# Patient Record
Sex: Male | Born: 1955
Health system: Southern US, Community
[De-identification: ages and names within clinical notes are randomized; demographics above are authoritative.]

## PROBLEM LIST (undated history)

## (undated) DIAGNOSIS — K219 Gastro-esophageal reflux disease without esophagitis: Secondary | ICD-10-CM

## (undated) DIAGNOSIS — J302 Other seasonal allergic rhinitis: Secondary | ICD-10-CM

## (undated) DIAGNOSIS — G4733 Obstructive sleep apnea (adult) (pediatric): Secondary | ICD-10-CM

## (undated) DIAGNOSIS — J3089 Other allergic rhinitis: Secondary | ICD-10-CM

## (undated) DIAGNOSIS — I219 Acute myocardial infarction, unspecified: Secondary | ICD-10-CM

## (undated) DIAGNOSIS — E7849 Other hyperlipidemia: Secondary | ICD-10-CM

## (undated) DIAGNOSIS — J309 Allergic rhinitis, unspecified: Secondary | ICD-10-CM

## (undated) DIAGNOSIS — I213 ST elevation (STEMI) myocardial infarction of unspecified site: Secondary | ICD-10-CM

## (undated) DIAGNOSIS — F329 Major depressive disorder, single episode, unspecified: Secondary | ICD-10-CM

## (undated) DIAGNOSIS — I2510 Atherosclerotic heart disease of native coronary artery without angina pectoris: Secondary | ICD-10-CM

## (undated) DIAGNOSIS — Z87898 Personal history of other specified conditions: Secondary | ICD-10-CM

## (undated) HISTORY — DX: Other seasonal allergic rhinitis: J30.2

## (undated) HISTORY — DX: Major depressive disorder, single episode, unspecified: F32.9

## (undated) HISTORY — DX: Acute myocardial infarction, unspecified: I21.9

## (undated) HISTORY — DX: Allergic rhinitis, unspecified: J30.9

## (undated) HISTORY — DX: ST elevation (STEMI) myocardial infarction of unspecified site: I21.3

## (undated) HISTORY — DX: Personal history of other specified conditions: Z87.898

## (undated) HISTORY — DX: Other hyperlipidemia: E78.49

## (undated) HISTORY — DX: Obstructive sleep apnea (adult) (pediatric): G47.33

## (undated) HISTORY — DX: Other allergic rhinitis: J30.89

## (undated) SURGERY — LEFT HEART CATH AND CORONARY ANGIOGRAPHY
Anesthesia: Moderate Sedation

---

## 2008-05-06 ENCOUNTER — Ambulatory Visit: Payer: Self-pay | Admitting: Internal Medicine

## 2010-10-19 ENCOUNTER — Ambulatory Visit: Payer: Self-pay | Admitting: Otolaryngology

## 2011-06-01 ENCOUNTER — Ambulatory Visit: Payer: Self-pay | Admitting: Internal Medicine

## 2011-06-05 ENCOUNTER — Ambulatory Visit: Payer: Self-pay | Admitting: Internal Medicine

## 2012-04-25 ENCOUNTER — Ambulatory Visit: Payer: Self-pay | Admitting: Internal Medicine

## 2013-02-26 ENCOUNTER — Ambulatory Visit: Payer: Self-pay | Admitting: Internal Medicine

## 2013-04-15 ENCOUNTER — Ambulatory Visit: Payer: Self-pay | Admitting: Gastroenterology

## 2013-05-09 ENCOUNTER — Ambulatory Visit: Payer: Self-pay | Admitting: Physician Assistant

## 2013-05-09 LAB — URINALYSIS, COMPLETE
Bacteria: NEGATIVE
Bilirubin,UR: NEGATIVE
GLUCOSE, UR: NEGATIVE mg/dL (ref 0–75)
KETONE: NEGATIVE
Leukocyte Esterase: NEGATIVE
Nitrite: NEGATIVE
PH: 6 (ref 4.5–8.0)
Protein: NEGATIVE
SPECIFIC GRAVITY: 1.02 (ref 1.003–1.030)
SQUAMOUS EPITHELIAL: NONE SEEN
WBC UR: NONE SEEN /HPF (ref 0–5)

## 2013-05-09 LAB — GC/CHLAMYDIA PROBE AMP

## 2013-05-11 LAB — URINE CULTURE

## 2013-06-11 ENCOUNTER — Ambulatory Visit: Payer: Self-pay | Admitting: Urology

## 2014-03-15 IMAGING — US ABDOMEN ULTRASOUND LIMITED
1 series · 14 of 25 positions shown · non-contrast
Comparison: None.

CLINICAL DATA: Epigastric pain.

EXAM:
US ABDOMEN LIMITED - RIGHT UPPER QUADRANT

[Series 1: abdomen ultrasound limited · 0.25mm/px · 14 of 55 slices shown]
[im 1/55]
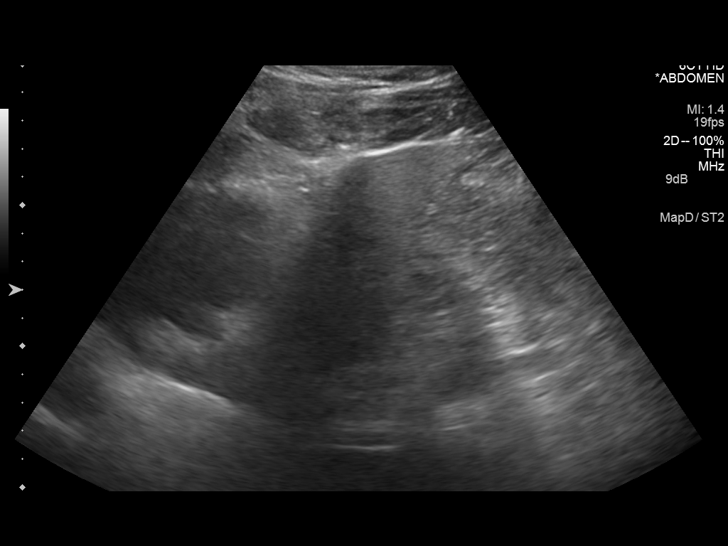
[im 5/55]
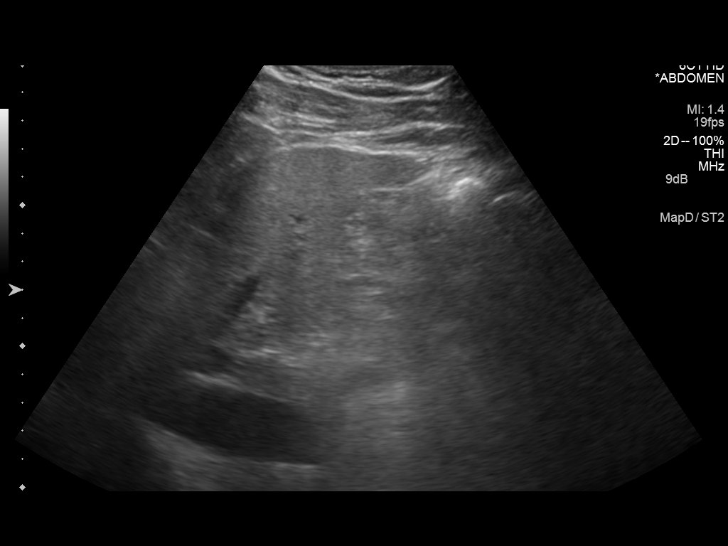
[im 10/55]
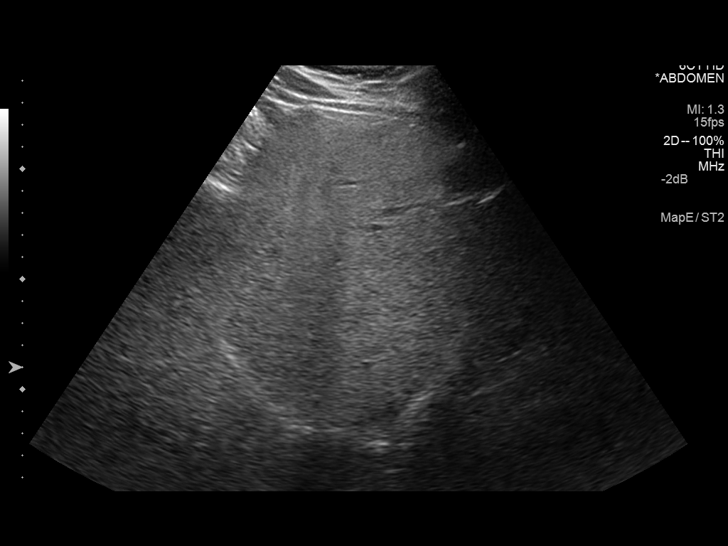
[im 14/55]
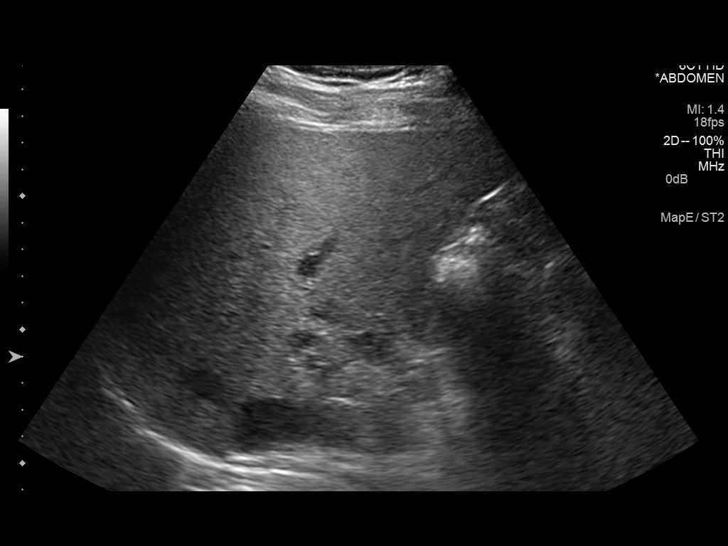
[im 19/55]
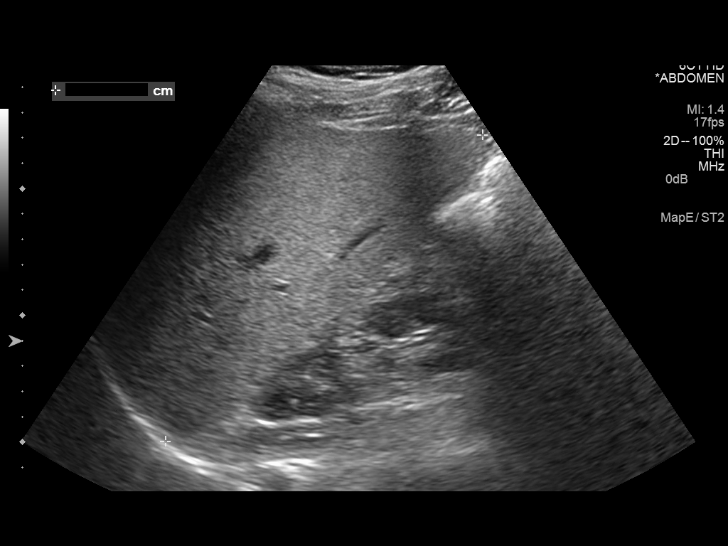
[im 21/55]
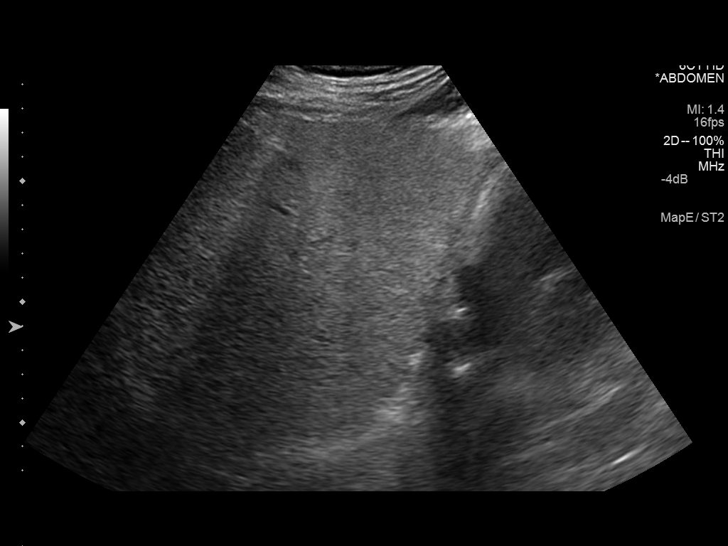
[im 25/55]
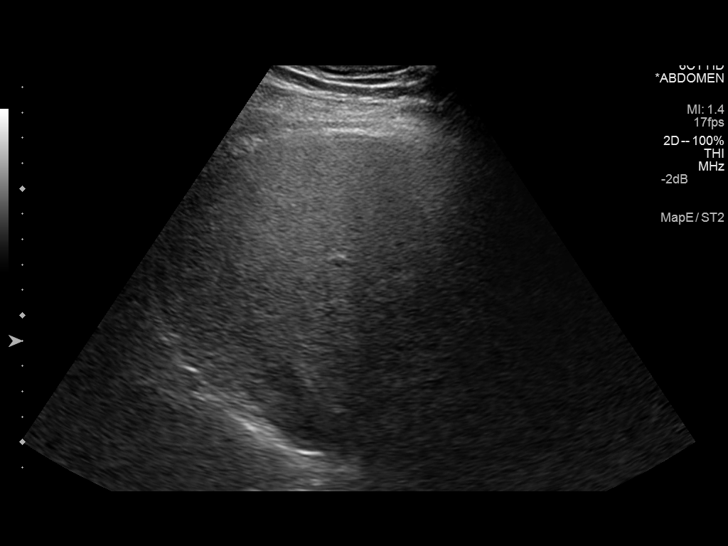
[im 30/55]
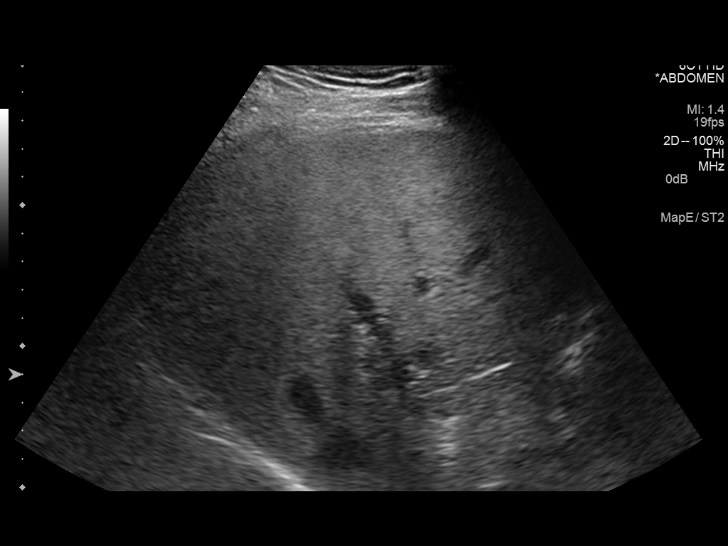
[im 34/55]
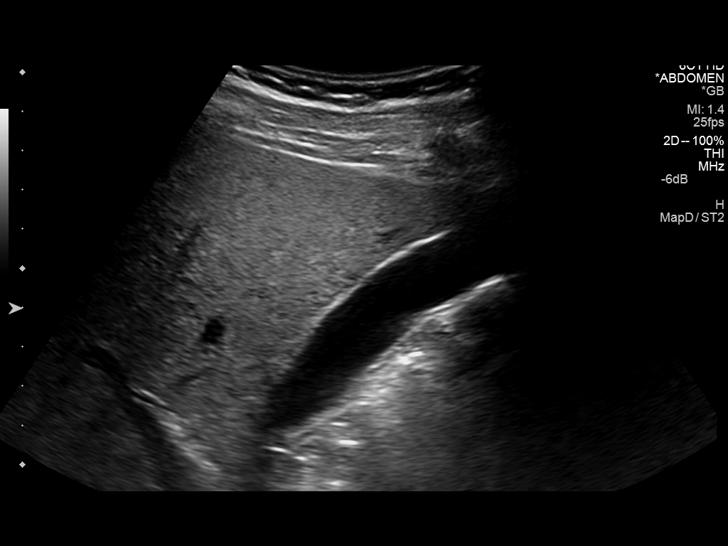
[im 37/55]
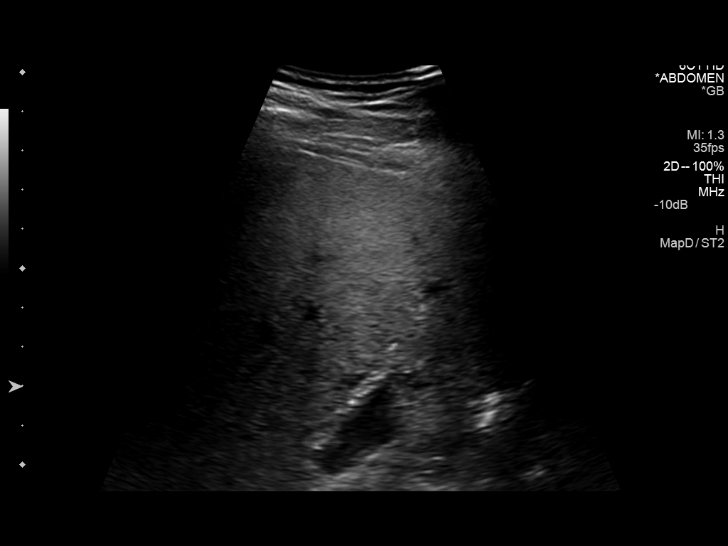
[im 41/55]
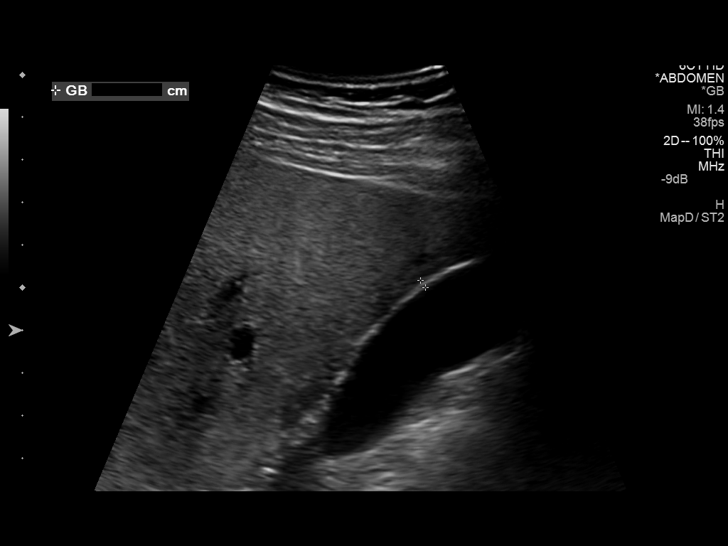
[im 46/55]
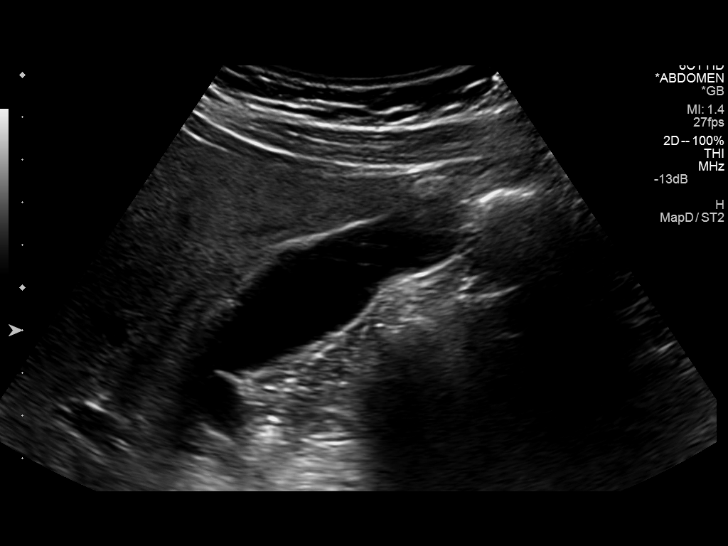
[im 50/55]
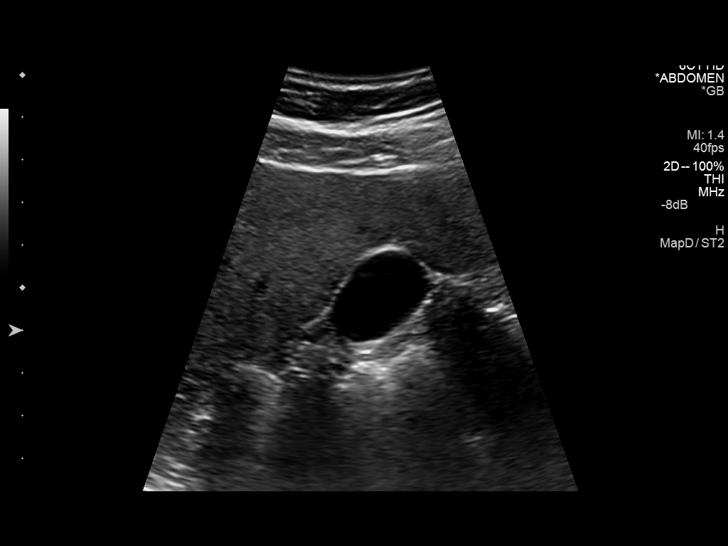
[im 55/55]
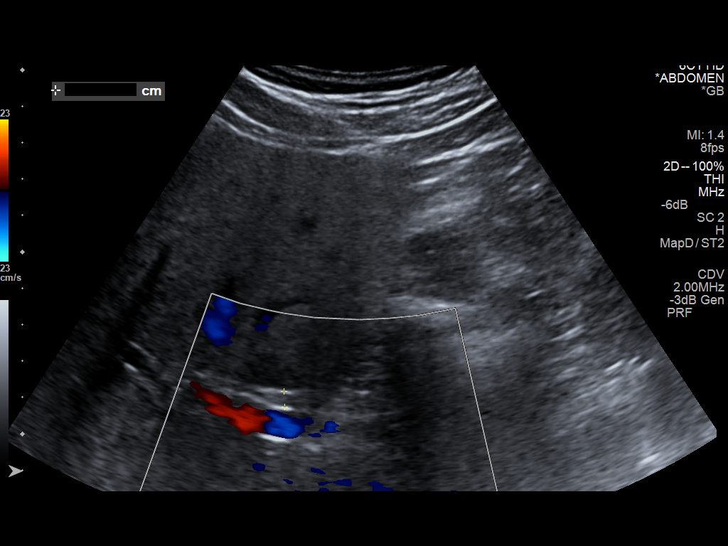

[14 of 25 positions shown; findings below may reference images not displayed]

FINDINGS: Gallbladder:

No gallstones or wall thickening visualized. No sonographic Murphy
sign noted.

Common bile duct:

Diameter: Measures 4.3 mm which is within normal limits.

Liver:

No focal lesion is noted. Increased echogenicity of hepatic
parenchyma is noted suggesting fatty infiltration.
IMPRESSION: Probable fatty infiltration of the liver. No other abnormality seen
in the right upper quadrant of the abdomen.

## 2014-04-22 ENCOUNTER — Ambulatory Visit: Payer: Self-pay | Admitting: Family Medicine

## 2014-04-23 ENCOUNTER — Ambulatory Visit: Payer: Self-pay | Admitting: Internal Medicine

## 2014-06-13 HISTORY — PX: CORONARY ANGIOPLASTY WITH STENT PLACEMENT: SHX49

## 2014-06-20 DIAGNOSIS — I219 Acute myocardial infarction, unspecified: Secondary | ICD-10-CM

## 2014-06-20 HISTORY — DX: Acute myocardial infarction, unspecified: I21.9

## 2014-06-21 DIAGNOSIS — I2510 Atherosclerotic heart disease of native coronary artery without angina pectoris: Secondary | ICD-10-CM | POA: Insufficient documentation

## 2014-06-21 DIAGNOSIS — E7849 Other hyperlipidemia: Secondary | ICD-10-CM

## 2014-06-21 HISTORY — DX: Other hyperlipidemia: E78.49

## 2014-07-06 DIAGNOSIS — Z87891 Personal history of nicotine dependence: Secondary | ICD-10-CM | POA: Insufficient documentation

## 2014-07-06 DIAGNOSIS — I213 ST elevation (STEMI) myocardial infarction of unspecified site: Secondary | ICD-10-CM

## 2014-07-06 DIAGNOSIS — I219 Acute myocardial infarction, unspecified: Secondary | ICD-10-CM

## 2014-07-06 DIAGNOSIS — Z720 Tobacco use: Secondary | ICD-10-CM | POA: Insufficient documentation

## 2014-07-06 DIAGNOSIS — K219 Gastro-esophageal reflux disease without esophagitis: Secondary | ICD-10-CM | POA: Insufficient documentation

## 2014-07-06 HISTORY — DX: ST elevation (STEMI) myocardial infarction of unspecified site: I21.3

## 2014-07-06 HISTORY — DX: Acute myocardial infarction, unspecified: I21.9

## 2014-07-16 ENCOUNTER — Telehealth: Payer: Self-pay | Admitting: Gastroenterology

## 2014-07-16 NOTE — Telephone Encounter (Signed)
Pt needs a refill on Dexilant

## 2014-07-19 NOTE — Telephone Encounter (Signed)
Pt did mention he sometimes takes 2 a day and wants to know if that is ok and please call into [facility] in [place], pt will be by 6/7 to pick up more samples in case insurance denies

## 2014-07-21 ENCOUNTER — Other Ambulatory Visit: Payer: Self-pay

## 2014-07-21 DIAGNOSIS — K219 Gastro-esophageal reflux disease without esophagitis: Secondary | ICD-10-CM

## 2014-07-21 MED ORDER — DEXLANSOPRAZOLE 60 MG PO CPDR: 60.0000 mg | DELAYED_RELEASE_CAPSULE | Freq: Every day | ORAL | Status: DC

## 2014-07-21 NOTE — Telephone Encounter (Signed)
Rx sent to [facility] per pt request.

## 2014-07-28 ENCOUNTER — Other Ambulatory Visit: Payer: Self-pay

## 2014-07-28 ENCOUNTER — Telehealth: Payer: Self-pay | Admitting: Gastroenterology

## 2014-07-28 DIAGNOSIS — K219 Gastro-esophageal reflux disease without esophagitis: Secondary | ICD-10-CM

## 2014-07-28 MED ORDER — DEXLANSOPRAZOLE 60 MG PO CPDR: 60.0000 mg | DELAYED_RELEASE_CAPSULE | Freq: Every day | ORAL | Status: DC

## 2014-07-28 NOTE — Telephone Encounter (Signed)
Pt waiting for a RX. Could you write it for 90 days per wife.

## 2014-07-28 NOTE — Telephone Encounter (Signed)
Rx for Dexilant 60mg  90 day supply sent to [facility] per pt request.

## 2014-08-04 DIAGNOSIS — E782 Mixed hyperlipidemia: Secondary | ICD-10-CM | POA: Insufficient documentation

## 2014-08-13 ENCOUNTER — Encounter: Payer: Self-pay | Admitting: Emergency Medicine

## 2014-08-13 ENCOUNTER — Other Ambulatory Visit: Payer: Self-pay

## 2014-08-13 ENCOUNTER — Emergency Department
Admission: EM | Admit: 2014-08-13 | Discharge: 2014-08-13 | Disposition: A | Discharge status: Home / Self Care | Payer: BLUE CROSS/BLUE SHIELD | Attending: Emergency Medicine | Admitting: Emergency Medicine

## 2014-08-13 DIAGNOSIS — H8110 Benign paroxysmal vertigo, unspecified ear: Secondary | ICD-10-CM

## 2014-08-13 DIAGNOSIS — Z87891 Personal history of nicotine dependence: Secondary | ICD-10-CM | POA: Insufficient documentation

## 2014-08-13 DIAGNOSIS — I2510 Atherosclerotic heart disease of native coronary artery without angina pectoris: Secondary | ICD-10-CM | POA: Insufficient documentation

## 2014-08-13 DIAGNOSIS — Z79899 Other long term (current) drug therapy: Secondary | ICD-10-CM | POA: Insufficient documentation

## 2014-08-13 DIAGNOSIS — R42 Dizziness and giddiness: Secondary | ICD-10-CM | POA: Diagnosis present

## 2014-08-13 HISTORY — DX: Atherosclerotic heart disease of native coronary artery without angina pectoris: I25.10

## 2014-08-13 LAB — BASIC METABOLIC PANEL
Anion gap: 7 (ref 5–15)
BUN: 12 mg/dL (ref 6–20)
CO2: 24 mmol/L (ref 22–32)
Calcium: 9 mg/dL (ref 8.9–10.3)
Chloride: 107 mmol/L (ref 101–111)
Creatinine, Ser: 0.91 mg/dL (ref 0.61–1.24)
GFR calc Af Amer: >60 mL/min (ref >60)
GFR calc non Af Amer: >60 mL/min (ref >60)
Glucose, Bld: 103 mg/dL — ABNORMAL HIGH (ref 65–99)
Potassium: 4 mmol/L (ref 3.5–5.1)
Sodium: 138 mmol/L (ref 135–145)

## 2014-08-13 LAB — CBC
HEMATOCRIT: 38.3 % — AB (ref 40.0–52.0)
Hemoglobin: 13.2 g/dL (ref 13.0–18.0)
MCH: 31.7 pg (ref 26.0–34.0)
MCHC: 34.5 g/dL (ref 32.0–36.0)
MCV: 91.8 fL (ref 80.0–100.0)
Platelets: 252 10*3/uL (ref 150–440)
RBC: 4.17 MIL/uL — ABNORMAL LOW (ref 4.40–5.90)
RDW: 13.4 % (ref 11.5–14.5)
WBC: 6.5 10*3/uL (ref 3.8–10.6)

## 2014-08-13 LAB — TROPONIN I: Troponin I: <0.03 ng/mL (ref <0.031)

## 2014-08-13 MED ORDER — MECLIZINE HCL 25 MG PO TABS: 25.0000 mg | ORAL_TABLET | Freq: Three times a day (TID) | ORAL | Status: AC | PRN

## 2014-08-13 MED ORDER — MECLIZINE HCL 25 MG PO TABS
25.0000 mg | ORAL_TABLET | Freq: Once | ORAL | Status: AC
  Administered 2014-08-13: 25 mg via ORAL

## 2014-08-13 MED ORDER — MECLIZINE HCL 25 MG PO TABS
ORAL_TABLET | ORAL | Status: AC
  Administered 2014-08-13: 25 mg via ORAL
  Filled 2014-08-13: qty 1

## 2014-08-13 NOTE — ED Provider Notes (Signed)
[facility] Emergency Department Provider Note  ____________________________________________  Time seen: 11:00 PM  I have reviewed the triage vital signs and the nursing notes.   HISTORY  Chief Complaint Dizziness     HPI William Conway is a 59 y.o. male presents with acute onset of dizziness that is positional worse with movement of the head and change of position.     Past Medical History  Diagnosis Date  . Coronary artery disease     There are no active problems to display for this patient.   Past Surgical History  Procedure Laterality Date  . Coronary angioplasty with stent placement      Current Outpatient Rx  Name  Route  Sig  Dispense  Refill  . dexlansoprazole (DEXILANT) 60 MG capsule   Oral   Take 1 capsule (60 mg total) by mouth daily.   90 capsule   3     Allergies Review of patient's allergies indicates no known allergies.  History reviewed. No pertinent family history.  Social History History  Substance Use Topics  . Smoking status: Former Research scientist (life sciences)  . Smokeless tobacco: Not on file  . Alcohol Use: Yes    Review of Systems  Constitutional: Negative for fever. Eyes: Negative for visual changes. ENT: Negative for sore throat. Cardiovascular: Negative for chest pain. Respiratory: Negative for shortness of breath. Gastrointestinal: Negative for abdominal pain, vomiting and diarrhea. Genitourinary: Negative for dysuria. Musculoskeletal: Negative for back pain. Skin: Negative for rash. Neurological: Negative for headaches, focal weakness or numbness.   10-point ROS otherwise negative.  ____________________________________________   PHYSICAL EXAM:  VITAL SIGNS: ED Triage Vitals  Enc Vitals Group     BP --      Pulse Rate 08/13/14 1836 64     Resp 08/13/14 1836 16     Temp 08/13/14 1836 98.1 F (36.7 C)     Temp Source 08/13/14 1836 Oral     SpO2 08/13/14 1836 98 %     Weight 08/13/14 1836 195 lb  (88.451 kg)     Height 08/13/14 1836 5\' 10"  (1.778 m)     Head Cir --      Peak Flow --      Pain Score 08/13/14 2316 0     Pain Loc --      Pain Edu? --      Excl. in [place]? --      Constitutional: Alert and oriented. Well appearing and in no distress. Eyes: Conjunctivae are normal. PERRL. Normal extraocular movements. ENT   Head: Normocephalic and atraumatic.   Nose: No congestion/rhinnorhea.   Mouth/Throat: Mucous membranes are moist.   Neck: No stridor. Cardiovascular: Normal rate, regular rhythm. Normal and symmetric distal pulses are present in all extremities. No murmurs, rubs, or gallops. Respiratory: Normal respiratory effort without tachypnea nor retractions. Breath sounds are clear and equal bilaterally. No wheezes/rales/rhonchi. Gastrointestinal: Soft and nontender. No distention. There is no CVA tenderness. Genitourinary: deferred Musculoskeletal: Nontender with normal range of motion in all extremities. No joint effusions.  No lower extremity tenderness nor edema. Neurologic:  Normal speech and language. No gross focal neurologic deficits are appreciated. Speech is normal.  Skin:  Skin is warm, dry and intact. No rash noted. Psychiatric: Mood and affect are normal. Speech and behavior are normal. Patient exhibits appropriate insight and judgment.  ____________________________________________    LABS (pertinent positives/negatives)  Labs Reviewed  CBC - Abnormal; Notable for the following:    RBC 4.17 (*)  HCT 38.3 (*)    All other components within normal limits  BASIC METABOLIC PANEL - Abnormal; Notable for the following:    Glucose, Bld 103 (*)    All other components within normal limits  TROPONIN I     ____________________________________________   EKG Interpreted by me and Dr. Marjean Donna  Date: 08/13/2014  Rate: 62  Rhythm: normal sinus rhythm  QRS Axis: normal  Intervals: normal  ST/T Wave abnormalities: normal  Conduction  Disutrbances: none  Narrative Interpretation: unremarkable       ____________________________________________    RADIOLOGY    ____________________________________________     INITIAL IMPRESSION / ASSESSMENT AND PLAN / ED COURSE  Pertinent labs & imaging results that were available during my care of the patient were reviewed by me and considered in my medical decision making (see chart for details).  History of physical exam consistent with benign paroxysmal positional vertigo as such patient will be prescribed meclizine and receive a dose here in the emergency department for the same.  ____________________________________________   FINAL CLINICAL IMPRESSION(S) / ED DIAGNOSES  Final diagnoses:  None      Gregor Hams, MD 08/13/14 2340

## 2014-08-13 NOTE — ED Notes (Signed)
Pt reports dizziness and lightheadedness today. Worse when bending down or turning head fast.  Skin w/d

## 2014-08-13 NOTE — ED Notes (Signed)
Pt. Lying BP 133/79  Pulse 66  Rt. ARM Pt. Sitting 148/89  Pulse 58 Pt. Standing  139/91  Pulse 56

## 2014-08-13 NOTE — ED Notes (Signed)
Dr. Brown at bedside

## 2014-08-13 NOTE — Discharge Instructions (Signed)
Benign Positional Vertigo Vertigo means you feel like you or your surroundings are moving when they are not. Benign positional vertigo is the most common form of vertigo. Benign means that the cause of your condition is not serious. Benign positional vertigo is more common in older adults. CAUSES  Benign positional vertigo is the result of an upset in the labyrinth system. This is an area in the middle ear that helps control your balance. This may be caused by a viral infection, head injury, or repetitive motion. However, often no specific cause is found. SYMPTOMS  Symptoms of benign positional vertigo occur when you move your head or eyes in different directions. Some of the symptoms may include:  Loss of balance and falls.  Vomiting.  Blurred vision.  Dizziness.  Nausea.  Involuntary eye movements (nystagmus). DIAGNOSIS  Benign positional vertigo is usually diagnosed by physical exam. If the specific cause of your benign positional vertigo is unknown, your caregiver may perform imaging tests, such as magnetic resonance imaging (MRI) or computed tomography (CT). TREATMENT  Your caregiver may recommend movements or procedures to correct the benign positional vertigo. Medicines such as meclizine, benzodiazepines, and medicines for nausea may be used to treat your symptoms. In rare cases, if your symptoms are caused by certain conditions that affect the inner ear, you may need surgery. HOME CARE INSTRUCTIONS   Follow your caregiver's instructions.  Move slowly. Do not make sudden body or head movements.  Avoid driving.  Avoid operating heavy machinery.  Avoid performing any tasks that would be dangerous to you or others during a vertigo episode.  Drink enough fluids to keep your urine clear or pale yellow. SEEK IMMEDIATE MEDICAL CARE IF:   You develop problems with walking, weakness, numbness, or using your arms, hands, or legs.  You have difficulty speaking.  You develop  severe headaches.  Your nausea or vomiting continues or gets worse.  You develop visual changes.  Your family or friends notice any behavioral changes.  Your condition gets worse.  You have a fever.  You develop a stiff neck or sensitivity to light. MAKE SURE YOU:   Understand these instructions.  Will watch your condition.  Will get help right away if you are not doing well or get worse. Document Released: 11/06/2005 Document Revised: 04/23/2011 Document Reviewed: 10/19/2010 ExitCare Patient Information 2015 [employer]. This information is not intended to replace advice given to you by your health care provider. Make sure you discuss any questions you have with your health care provider.    

## 2014-11-17 ENCOUNTER — Other Ambulatory Visit: Payer: Self-pay | Admitting: Internal Medicine

## 2014-11-17 ENCOUNTER — Encounter: Payer: Self-pay | Admitting: Internal Medicine

## 2014-11-17 DIAGNOSIS — Z8709 Personal history of other diseases of the respiratory system: Secondary | ICD-10-CM | POA: Insufficient documentation

## 2014-11-17 DIAGNOSIS — J302 Other seasonal allergic rhinitis: Secondary | ICD-10-CM

## 2014-11-17 DIAGNOSIS — Z87898 Personal history of other specified conditions: Secondary | ICD-10-CM

## 2014-11-17 DIAGNOSIS — F329 Major depressive disorder, single episode, unspecified: Secondary | ICD-10-CM | POA: Insufficient documentation

## 2014-11-17 DIAGNOSIS — G4733 Obstructive sleep apnea (adult) (pediatric): Secondary | ICD-10-CM | POA: Insufficient documentation

## 2014-11-17 DIAGNOSIS — F32A Depression, unspecified: Secondary | ICD-10-CM | POA: Insufficient documentation

## 2014-11-17 DIAGNOSIS — J309 Allergic rhinitis, unspecified: Secondary | ICD-10-CM

## 2014-11-17 DIAGNOSIS — J3089 Other allergic rhinitis: Secondary | ICD-10-CM

## 2014-11-17 HISTORY — DX: Obstructive sleep apnea (adult) (pediatric): G47.33

## 2014-11-17 HISTORY — DX: Other seasonal allergic rhinitis: J30.2

## 2014-11-17 HISTORY — DX: Major depressive disorder, single episode, unspecified: F32.9

## 2014-11-17 HISTORY — DX: Depression, unspecified: F32.A

## 2014-11-17 HISTORY — DX: Allergic rhinitis, unspecified: J30.9

## 2014-11-17 HISTORY — DX: Personal history of other specified conditions: Z87.898

## 2015-03-15 ENCOUNTER — Encounter: Payer: Self-pay | Admitting: Urology

## 2015-03-15 ENCOUNTER — Ambulatory Visit (INDEPENDENT_AMBULATORY_CARE_PROVIDER_SITE_OTHER): Payer: BLUE CROSS/BLUE SHIELD | Admitting: Urology

## 2015-03-15 VITALS — BP 155/103 | HR 73 | Ht 70.0 in | Wt 208.6 lb

## 2015-03-15 DIAGNOSIS — Z125 Encounter for screening for malignant neoplasm of prostate: Secondary | ICD-10-CM | POA: Diagnosis not present

## 2015-03-15 DIAGNOSIS — R300 Dysuria: Secondary | ICD-10-CM | POA: Diagnosis not present

## 2015-03-15 DIAGNOSIS — R3129 Other microscopic hematuria: Secondary | ICD-10-CM | POA: Diagnosis not present

## 2015-03-15 LAB — URINALYSIS, COMPLETE
Bilirubin, UA: NEGATIVE
Glucose, UA: NEGATIVE
Ketones, UA: NEGATIVE
Leukocytes, UA: NEGATIVE
NITRITE UA: NEGATIVE
Protein, UA: NEGATIVE
Specific Gravity, UA: 1.015 (ref 1.005–1.030)
Urobilinogen, Ur: 0.2 mg/dL (ref 0.2–1.0)
pH, UA: 6 (ref 5.0–7.5)

## 2015-03-15 LAB — MICROSCOPIC EXAMINATION: Epithelial Cells (non renal): NONE SEEN /hpf (ref 0–10)

## 2015-03-15 NOTE — Progress Notes (Signed)
03/15/2015 3:31 PM   William Conway 1956/01/25 [id number]  Referring provider: Glean Hess, MD [address] [place], [place] [address]  Chief Complaint  Patient presents with  . Penis Pain    penile burning that lasted x 30 days that subsided x 4 days ago    HPI:  1 - Dysuria - pt with few "spells" of dysuria over several years. GC and UCX negative x several. 40PY smoker (now quit) wit microhematuira as per below. Not large bother at present, but was just few weeks ago.   2 - Prostate Screening - No FHX prostate cancer 02/2015 DRE 50gm smooth / PSA (today / pending)  3 - Microscopic Hematuria - pt with blood on UA at PCP and our office x several. Denies gross episodes. Prior 40PY smoker. No piror cysto, thow reccomended x several.  PMH sig for CAD/MI/Stent/Brilenta, No surgeries. His PCP is Dr. Wadie Lessen.   Today "William Conway" is seen in f/u above. He has another spell of dysuria that has resolved. His microhematuria is still present.   PMH: Past Medical History  Diagnosis Date  . Coronary artery disease   . Heart attack ([place]) 07/06/2014    Overview:  A. 2009: Perfusion Scan Normal B. STEMI: Cardiac Cath: EF 65%. Acute occlusion RCA, nonobstructive LAD, LCX 5/16 with [facility] Scientific Promus 3X32 DES   . Myocardial infarction ([place]) 06/20/2014    Overview:  A. 2009: Perfusion Scan Normal B. STEMI: Cardiac Cath: EF 65%. Acute occlusion RCA, nonobstructive LAD, LCX 5/16 with [facility] Scientific Promus 3X32 DES   . ST elevation myocardial infarction (STEMI) ([place]) 07/06/2014    Overview:  Overview:  Overview:  A. 2009: Perfusion Scan Normal B. STEMI: Cardiac Cath: EF 65%. Acute occlusion RCA, nonobstructive LAD, LCX 5/16 with [facility] Scientific Promus 3X32 DES   . Allergic rhinitis 11/17/2014  . Obstructive apnea 11/17/2014    Overview:  Overview:  Intolerant of CPAP   . OSA (obstructive sleep apnea) 11/17/2014    Intolerant of CPAP   . Seasonal and perennial allergic  rhinitis 11/17/2014  . Mood disorder of depressed type 11/17/2014  . History of multiple pulmonary nodules 11/17/2014    Stable nodules 3 nodules 02/26/13   . Familial multiple lipoprotein-type hyperlipidemia 06/21/2014    Surgical History: Past Surgical History  Procedure Laterality Date  . Coronary angioplasty with stent placement  06/2014    to RCA    Home Medications:    Medication List       This list is accurate as of: 03/15/15  3:31 PM.  Always use your most recent med list.               aspirin EC 81 MG tablet  Take 1 tablet by mouth daily.     BRILINTA 90 MG Tabs tablet  Generic drug:  ticagrelor  Take 1 tablet by mouth 2 (two) times daily.     buPROPion 150 MG 12 hr tablet  Commonly known as:  WELLBUTRIN SR  Take 1 tablet by mouth 2 (two) times daily.     dexlansoprazole 60 MG capsule  Commonly known as:  DEXILANT  Take 1 capsule (60 mg total) by mouth daily.     losartan 25 MG tablet  Commonly known as:  COZAAR  Take 1 tablet by mouth daily.     meclizine 25 MG tablet  Commonly known as:  ANTIVERT  Take 1 tablet (25 mg total) by mouth 3 (three) times daily as needed  for dizziness.     metoprolol succinate 25 MG 24 hr tablet  Commonly known as:  TOPROL-XL  Take 1 tablet by mouth daily.     nitroGLYCERIN 0.4 MG SL tablet  Commonly known as:  NITROSTAT  Place under the tongue.     pantoprazole 40 MG tablet  Commonly known as:  PROTONIX  Take 1 tablet by mouth daily.     rosuvastatin 5 MG tablet  Commonly known as:  CRESTOR  Take 1 tablet by mouth daily.        Allergies:  Allergies  Allergen Reactions  . Atorvastatin     Other reaction(s): Muscle Pain  . Procaine Other (See Comments)    Family History: Family History  Problem Relation Age of Onset  . Hypertension Brother   . CAD Father   . Prostate cancer Neg Hx     Social History:  reports that he quit smoking about 8 months ago. He does not have any smokeless tobacco history on  file. He reports that he drinks about 2.4 oz of alcohol per week. His drug history is not on file.   Review of Systems  Gastrointestinal (upper)  : Negative for upper GI symptoms  Gastrointestinal (lower) : Negative for lower GI symptoms  Constitutional : Negative for symptoms  Skin: Negative for skin symptoms  Eyes: Negative for eye symptoms  Ear/Nose/Throat : Negative for Ear/Nose/Throat symptoms  Hematologic/Lymphatic: Negative for Hematologic/Lymphatic symptoms  Cardiovascular : Negative for cardiovascular symptoms  Respiratory : Negative for respiratory symptoms  Endocrine: Negative for endocrine symptoms  Musculoskeletal: Negative for musculoskeletal symptoms  Neurological: Negative for neurological symptoms  Psychologic: Negative for psychiatric symptoms    Physical Exam: BP 155/103 mmHg  Pulse 73  Ht 5\' 10"  (1.778 m)  Wt 208 lb 9.6 oz (94.62 kg)  BMI 29.93 kg/m2  Constitutional:  Alert and oriented, No acute distress. HEENT: [place] AT, moist mucus membranes.  Trachea midline, no masses. Cardiovascular: No clubbing, cyanosis, or edema. Respiratory: Normal respiratory effort, no increased work of breathing. GI: Abdomen is soft, nontender, nondistended, no abdominal masses GU: No CVA tenderness. Phallus sraight and circ'd. Testes w/o masses. DRE 50gm smooth.  Skin: No rashes, bruises or suspicious lesions. Lymph: No cervical or inguinal adenopathy. Neurologic: Grossly intact, no focal deficits, moving all 4 extremities. Psychiatric: Normal mood and affect.  Laboratory Data: Lab Results  Component Value Date   WBC 6.5 08/13/2014   HGB 13.2 08/13/2014   HCT 38.3* 08/13/2014   MCV 91.8 08/13/2014   PLT 252 08/13/2014    Lab Results  Component Value Date   CREATININE 0.91 08/13/2014    No results found for: PSA  No results found for: TESTOSTERONE  No results found for: HGBA1C  Urinalysis    Component Value Date/Time   COLORURINE  YELLOW 05/09/2013 [place] 05/09/2013 1539   LABSPEC 1.020 05/09/2013 1539   PHURINE 6.0 05/09/2013 1539   GLUCOSEU NEGATIVE 05/09/2013 1539   HGBUR 1+ 05/09/2013 1539   BILIRUBINUR NEGATIVE 05/09/2013 [place] 05/09/2013 1539   PROTEINUR NEGATIVE 05/09/2013 1539   NITRITE NEGATIVE 05/09/2013 1539   LEUKOCYTESUR NEGATIVE 05/09/2013 1539    Pertinent Imaging: none  Assessment & Plan:    1 - Dysuria - Low suspicion for infectious etiology. More likely chronic pain spectrum problem but feel that completing hematuria eval also important to r/o bladder tumor as etiology.   2 - Prostate Screening - PSA today to complete eval.  3 - Microscopic hematuria - most concerning issue today. Again discussed DDX of benign and malignant etiology and of significant more concern in prior 40PY smoker and suggested completion of romal eval with labs, CT, Cysto. He wants to proceed.  PSA, BMP today and RTC for CT and cysto.    No Follow-up on file.  Alexis Frock, [facility] [address], [place], [place] [address] [phone]

## 2015-03-16 LAB — BASIC METABOLIC PANEL
BUN/Creatinine Ratio: 10 (ref 9–20)
BUN: 9 mg/dL (ref 6–24)
CALCIUM: 9.5 mg/dL (ref 8.7–10.2)
CO2: 24 mmol/L (ref 18–29)
Chloride: 103 mmol/L (ref 96–106)
Creatinine, Ser: 0.93 mg/dL (ref 0.76–1.27)
GFR, EST AFRICAN AMERICAN: 104 mL/min/{1.73_m2} (ref >59)
GFR, EST NON AFRICAN AMERICAN: 90 mL/min/{1.73_m2} (ref >59)
Glucose: 104 mg/dL — ABNORMAL HIGH (ref 65–99)
Potassium: 4.4 mmol/L (ref 3.5–5.2)
Sodium: 144 mmol/L (ref 134–144)

## 2015-03-16 LAB — PSA: PROSTATE SPECIFIC AG, SERUM: 1.2 ng/mL (ref 0.0–4.0)

## 2015-03-28 ENCOUNTER — Ambulatory Visit
Admission: RE | Admit: 2015-03-28 | Discharge: 2015-03-28 | Disposition: A | Discharge status: Home / Self Care | Payer: BLUE CROSS/BLUE SHIELD | Source: Ambulatory Visit | Attending: Urology | Admitting: Urology

## 2015-03-28 DIAGNOSIS — R3129 Other microscopic hematuria: Secondary | ICD-10-CM | POA: Diagnosis not present

## 2015-03-28 DIAGNOSIS — D3501 Benign neoplasm of right adrenal gland: Secondary | ICD-10-CM | POA: Diagnosis not present

## 2015-03-28 DIAGNOSIS — I2510 Atherosclerotic heart disease of native coronary artery without angina pectoris: Secondary | ICD-10-CM | POA: Insufficient documentation

## 2015-03-28 DIAGNOSIS — K760 Fatty (change of) liver, not elsewhere classified: Secondary | ICD-10-CM | POA: Diagnosis not present

## 2015-03-28 MED ORDER — IOHEXOL 350 MG/ML SOLN
125.0000 mL | Freq: Once | INTRAVENOUS | Status: AC | PRN
  Administered 2015-03-28: 125 mL via INTRAVENOUS

## 2015-04-04 ENCOUNTER — Other Ambulatory Visit: Payer: BLUE CROSS/BLUE SHIELD

## 2015-04-07 ENCOUNTER — Other Ambulatory Visit: Payer: BLUE CROSS/BLUE SHIELD

## 2015-05-02 ENCOUNTER — Other Ambulatory Visit: Payer: BLUE CROSS/BLUE SHIELD

## 2015-05-23 ENCOUNTER — Other Ambulatory Visit: Payer: BLUE CROSS/BLUE SHIELD

## 2015-09-05 ENCOUNTER — Other Ambulatory Visit: Payer: BLUE CROSS/BLUE SHIELD

## 2015-12-01 ENCOUNTER — Other Ambulatory Visit: Payer: Self-pay | Admitting: Family Medicine

## 2015-12-01 DIAGNOSIS — R911 Solitary pulmonary nodule: Secondary | ICD-10-CM

## 2016-01-02 ENCOUNTER — Ambulatory Visit
Admission: RE | Admit: 2016-01-02 | Discharge: 2016-01-02 | Disposition: A | Discharge status: Home / Self Care | Payer: BLUE CROSS/BLUE SHIELD | Source: Ambulatory Visit | Attending: Family Medicine | Admitting: Family Medicine

## 2016-01-02 DIAGNOSIS — D3501 Benign neoplasm of right adrenal gland: Secondary | ICD-10-CM | POA: Diagnosis not present

## 2016-01-02 DIAGNOSIS — R918 Other nonspecific abnormal finding of lung field: Secondary | ICD-10-CM | POA: Insufficient documentation

## 2016-01-02 DIAGNOSIS — R911 Solitary pulmonary nodule: Secondary | ICD-10-CM

## 2016-10-10 ENCOUNTER — Other Ambulatory Visit: Payer: Self-pay | Admitting: Gastroenterology

## 2016-10-10 DIAGNOSIS — R1310 Dysphagia, unspecified: Secondary | ICD-10-CM

## 2016-10-19 ENCOUNTER — Ambulatory Visit: Payer: BLUE CROSS/BLUE SHIELD

## 2016-12-06 ENCOUNTER — Encounter: Payer: Self-pay | Admitting: Internal Medicine

## 2017-01-02 ENCOUNTER — Encounter: Payer: Self-pay | Admitting: *Deleted

## 2017-01-02 ENCOUNTER — Encounter
Admission: RE | Disposition: A | Discharge status: Home / Self Care | Payer: Self-pay | Source: Ambulatory Visit | Attending: Cardiology

## 2017-01-02 ENCOUNTER — Ambulatory Visit
Admission: RE | Admit: 2017-01-02 | Discharge: 2017-01-02 | Disposition: A | Discharge status: Home / Self Care | Payer: BLUE CROSS/BLUE SHIELD | Source: Ambulatory Visit | Attending: Cardiology | Admitting: Cardiology

## 2017-01-02 DIAGNOSIS — K219 Gastro-esophageal reflux disease without esophagitis: Secondary | ICD-10-CM | POA: Diagnosis not present

## 2017-01-02 DIAGNOSIS — G4733 Obstructive sleep apnea (adult) (pediatric): Secondary | ICD-10-CM | POA: Diagnosis not present

## 2017-01-02 DIAGNOSIS — F419 Anxiety disorder, unspecified: Secondary | ICD-10-CM | POA: Insufficient documentation

## 2017-01-02 DIAGNOSIS — Z955 Presence of coronary angioplasty implant and graft: Secondary | ICD-10-CM | POA: Diagnosis not present

## 2017-01-02 DIAGNOSIS — Z7982 Long term (current) use of aspirin: Secondary | ICD-10-CM | POA: Insufficient documentation

## 2017-01-02 DIAGNOSIS — E782 Mixed hyperlipidemia: Secondary | ICD-10-CM | POA: Diagnosis not present

## 2017-01-02 DIAGNOSIS — I2510 Atherosclerotic heart disease of native coronary artery without angina pectoris: Secondary | ICD-10-CM | POA: Insufficient documentation

## 2017-01-02 DIAGNOSIS — Z8249 Family history of ischemic heart disease and other diseases of the circulatory system: Secondary | ICD-10-CM | POA: Diagnosis not present

## 2017-01-02 DIAGNOSIS — R079 Chest pain, unspecified: Secondary | ICD-10-CM

## 2017-01-02 DIAGNOSIS — Z7902 Long term (current) use of antithrombotics/antiplatelets: Secondary | ICD-10-CM | POA: Insufficient documentation

## 2017-01-02 DIAGNOSIS — Z87891 Personal history of nicotine dependence: Secondary | ICD-10-CM | POA: Insufficient documentation

## 2017-01-02 HISTORY — PX: LEFT HEART CATH AND CORONARY ANGIOGRAPHY: CATH118249

## 2017-01-02 SURGERY — LEFT HEART CATH AND CORONARY ANGIOGRAPHY
Anesthesia: Moderate Sedation

## 2017-01-02 MED ORDER — SODIUM CHLORIDE 0.9% FLUSH: 3.0000 mL | INTRAVENOUS | Status: DC | PRN

## 2017-01-02 MED ORDER — SODIUM CHLORIDE 0.9 % IV SOLN: 250.0000 mL | INTRAVENOUS | Status: DC | PRN

## 2017-01-02 MED ORDER — FENTANYL CITRATE (PF) 100 MCG/2ML IJ SOLN
INTRAMUSCULAR | Status: AC
Start: 2017-01-02 — End: 2017-01-02
  Filled 2017-01-02: qty 2

## 2017-01-02 MED ORDER — SODIUM CHLORIDE 0.9 % WEIGHT BASED INFUSION
3.0000 mL/kg/h | INTRAVENOUS | Status: AC
  Administered 2017-01-02: 3 mL/kg/h via INTRAVENOUS

## 2017-01-02 MED ORDER — FENTANYL CITRATE (PF) 100 MCG/2ML IJ SOLN
INTRAMUSCULAR | Status: DC | PRN
  Administered 2017-01-02: 25 ug via INTRAVENOUS

## 2017-01-02 MED ORDER — SODIUM CHLORIDE 0.9 % WEIGHT BASED INFUSION: 1.0000 mL/kg/h | INTRAVENOUS | Status: DC

## 2017-01-02 MED ORDER — ACETAMINOPHEN 325 MG PO TABS: 650.0000 mg | ORAL_TABLET | ORAL | Status: DC | PRN

## 2017-01-02 MED ORDER — LIDOCAINE HCL (PF) 1 % IJ SOLN
INTRAMUSCULAR | Status: AC
  Filled 2017-01-02: qty 30

## 2017-01-02 MED ORDER — SODIUM CHLORIDE 0.9% FLUSH: 3.0000 mL | Freq: Two times a day (BID) | INTRAVENOUS | Status: DC

## 2017-01-02 MED ORDER — HEPARIN (PORCINE) IN NACL 2-0.9 UNIT/ML-% IJ SOLN
INTRAMUSCULAR | Status: AC
  Filled 2017-01-02: qty 500

## 2017-01-02 MED ORDER — IOPAMIDOL (ISOVUE-300) INJECTION 61%
INTRAVENOUS | Status: DC | PRN
Start: 2017-01-02 — End: 2017-01-02
  Administered 2017-01-02: 90 mL via INTRA_ARTERIAL

## 2017-01-02 MED ORDER — ONDANSETRON HCL 4 MG/2ML IJ SOLN: 4.0000 mg | Freq: Four times a day (QID) | INTRAMUSCULAR | Status: DC | PRN

## 2017-01-02 MED ORDER — ASPIRIN 81 MG PO CHEW: 81.0000 mg | CHEWABLE_TABLET | ORAL | Status: DC

## 2017-01-02 MED ORDER — MIDAZOLAM HCL 2 MG/2ML IJ SOLN
INTRAMUSCULAR | Status: AC
  Filled 2017-01-02: qty 2

## 2017-01-02 MED ORDER — MIDAZOLAM HCL 2 MG/2ML IJ SOLN
INTRAMUSCULAR | Status: DC | PRN
Start: 2017-01-02 — End: 2017-01-02
  Administered 2017-01-02: 1 mg via INTRAVENOUS

## 2017-01-02 SURGICAL SUPPLY — 9 items
CATH 5FR JL4 DIAGNOSTIC (CATHETERS) ×2 IMPLANT
CATH 5FR PIGTAIL DIAGNOSTIC (CATHETERS) ×2 IMPLANT
CATH INFINITI JR4 5F (CATHETERS) ×2 IMPLANT
DEVICE CLOSURE MYNXGRIP 5F (Vascular Products) ×2 IMPLANT
KIT MANI 3VAL PERCEP (MISCELLANEOUS) ×2 IMPLANT
NEEDLE PERC 18GX7CM (NEEDLE) ×2 IMPLANT
PACK CARDIAC CATH (CUSTOM PROCEDURE TRAY) ×2 IMPLANT
SHEATH AVANTI 5FR X 11CM (SHEATH) ×2 IMPLANT
WIRE EMERALD 3MM-J .035X150CM (WIRE) ×2 IMPLANT

## 2017-01-02 NOTE — Progress Notes (Signed)
Dr. Saralyn Pilar in at bedside to speak with pt. And his wife.

## 2017-02-01 ENCOUNTER — Ambulatory Visit: Payer: BLUE CROSS/BLUE SHIELD | Admitting: Urology

## 2017-02-07 ENCOUNTER — Ambulatory Visit: Payer: BLUE CROSS/BLUE SHIELD | Admitting: Urology

## 2017-02-07 ENCOUNTER — Encounter: Payer: Self-pay | Admitting: Urology

## 2017-02-07 VITALS — BP 176/98 | HR 75 | Ht 70.0 in | Wt 207.6 lb

## 2017-02-07 DIAGNOSIS — Z125 Encounter for screening for malignant neoplasm of prostate: Secondary | ICD-10-CM | POA: Diagnosis not present

## 2017-02-07 DIAGNOSIS — N503 Cyst of epididymis: Secondary | ICD-10-CM | POA: Diagnosis not present

## 2017-02-07 DIAGNOSIS — R3129 Other microscopic hematuria: Secondary | ICD-10-CM

## 2017-02-07 DIAGNOSIS — R300 Dysuria: Secondary | ICD-10-CM

## 2017-02-07 LAB — URINALYSIS, COMPLETE
BILIRUBIN UA: NEGATIVE
GLUCOSE, UA: NEGATIVE
Ketones, UA: NEGATIVE
Leukocytes, UA: NEGATIVE
Nitrite, UA: NEGATIVE
PH UA: 7 (ref 5.0–7.5)
PROTEIN UA: NEGATIVE
Specific Gravity, UA: 1.01 (ref 1.005–1.030)
UUROB: 1 mg/dL (ref 0.2–1.0)

## 2017-02-07 LAB — MICROSCOPIC EXAMINATION
Bacteria, UA: NONE SEEN
EPITHELIAL CELLS (NON RENAL): NONE SEEN /HPF (ref 0–10)
WBC, UA: NONE SEEN /hpf (ref 0–?)

## 2017-02-07 NOTE — Progress Notes (Signed)
02/07/2017 3:36 PM   William Conway [birth year] [id number]  Referring provider: [name], MD [place], [place] [address]  Chief Complaint  Patient presents with  . Groin Pain    HPI: The patient is a 61 year old gentleman that presents today for dysuria.  1 - Dysuria - pt with few "spells" of dysuria over several years. GC and UCX negative x several. 40PY smoker (now quit) wit microhematuira as per below.  Dysuria is resolving  2 - Prostate Screening - No FHX prostate cancer -due for PSA/DRE  3 - Microscopic Hematuria - pt with blood on UA at PCP and our office x several. Denies gross episodes. Prior 40PY smoker. CT in 2/17 without source for hematuria. Never followed up for cysto. No prior cysto, thow reccomended x several.  4 - Scrotal mass  -patient noticed a lump above his testicle approximately 1 week ago.  He is concerned that this might be testicular cancer.  He did not notice it before.  Is nontender to palpation.  He feels it is separate and above from his testicle  5 - ED  -patient reports that he is unable to maintain an erection.  Is able to obtain one however.  He has a good sex drive.  He has never tried medication for this before.  Unfortunately, he is on nitrates.  PMH sig for CAD/MI/Stent/Brilenta, No surgeries.   Today patient is seen in f/u above. He has another spell of dysuria that has resolved. His microhematuria is still present.    PMH: Past Medical History:  Diagnosis Date  . Allergic rhinitis 11/17/2014  . Coronary artery disease   . Familial multiple lipoprotein-type hyperlipidemia 06/21/2014  . Heart attack ([place]) 07/06/2014   Overview:  A. 2009: Perfusion Scan Normal B. STEMI: Cardiac Cath: EF 65%. Acute occlusion RCA, nonobstructive LAD, LCX 5/16 with [facility] Scientific Promus 3X32 DES   . History of multiple pulmonary nodules 11/17/2014   Stable nodules 3 nodules 02/26/13   . Mood disorder of depressed type 11/17/2014  .  Myocardial infarction ([place]) 06/20/2014   Overview:  A. 2009: Perfusion Scan Normal B. STEMI: Cardiac Cath: EF 65%. Acute occlusion RCA, nonobstructive LAD, LCX 5/16 with [facility] Scientific Promus 3X32 DES   . Obstructive apnea 11/17/2014   Overview:  Overview:  Intolerant of CPAP   . OSA (obstructive sleep apnea) 11/17/2014   Intolerant of CPAP   . Seasonal and perennial allergic rhinitis 11/17/2014  . ST elevation myocardial infarction (STEMI) ([place]) 07/06/2014   Overview:  Overview:  Overview:  A. 2009: Perfusion Scan Normal B. STEMI: Cardiac Cath: EF 65%. Acute occlusion RCA, nonobstructive LAD, LCX 5/16 with [employer] Promus [phone] DES     Surgical History: Past Surgical History:  Procedure Laterality Date  . CORONARY ANGIOPLASTY WITH STENT PLACEMENT  06/2014   to RCA  . LEFT HEART CATH AND CORONARY ANGIOGRAPHY N/A 01/02/2017   Procedure: LEFT HEART CATH AND CORONARY ANGIOGRAPHY;  Surgeon: Isaias Cowman, MD;  Location: [facility];  Service: Cardiovascular;  Laterality: N/A;    Home Medications:  Allergies as of 02/07/2017      Reactions   Atorvastatin Other (See Comments)   Muscle Pain   Procaine Other (See Comments)   Unknown      Medication List        Accurate as of 02/07/17  3:36 PM. Always use your most recent med list.          acetaminophen 500  MG tablet Commonly known as:  TYLENOL Take 1,000 mg every 6 (six) hours as needed by mouth for moderate pain or headache.   buPROPion 150 MG 24 hr tablet Commonly known as:  WELLBUTRIN XL Take 150 mg daily by mouth.   clopidogrel 75 MG tablet Commonly known as:  PLAVIX Take 75 mg daily by mouth.   dexlansoprazole 60 MG capsule Commonly known as:  DEXILANT Take 1 capsule (60 mg total) by mouth daily.   losartan 25 MG tablet Commonly known as:  COZAAR Take 25 mg daily by mouth.   meclizine 25 MG tablet Commonly known as:  ANTIVERT Take 1 tablet (25 mg total) by mouth 3 (three) times daily as  needed for dizziness.   metoprolol tartrate 25 MG tablet Commonly known as:  LOPRESSOR Take 25 mg daily by mouth.   nitroGLYCERIN 0.4 MG SL tablet Commonly known as:  NITROSTAT Place 0.4 mg every 5 (five) minutes as needed under the tongue for chest pain.   rosuvastatin 5 MG tablet Commonly known as:  CRESTOR Take 5 mg 2 (two) times a week by mouth.   TACLONEX external suspension Generic drug:  calcipotriene-betamethasone Apply 1 application as needed topically (for flare ups).       Allergies:  Allergies  Allergen Reactions  . Atorvastatin Other (See Comments)    Muscle Pain  . Procaine Other (See Comments)    Unknown    Family History: Family History  Problem Relation Age of Onset  . Hypertension Brother   . CAD Father   . Prostate cancer Neg Hx     Social History:  reports that he quit smoking about 2 years ago. he has never used smokeless tobacco. He reports that he drinks about 2.4 oz of alcohol per week. His drug history is not on file.  ROS: UROLOGY Frequent Urination?: No Hard to postpone urination?: No Burning/pain with urination?: Yes Get up at night to urinate?: No Leakage of urine?: No Urine stream starts and stops?: No Trouble starting stream?: No Do you have to strain to urinate?: No Blood in urine?: No Urinary tract infection?: No Sexually transmitted disease?: No Injury to kidneys or bladder?: No Painful intercourse?: No Weak stream?: No Erection problems?: No Penile pain?: No  Gastrointestinal Nausea?: No Vomiting?: No Indigestion/heartburn?: No Diarrhea?: No Constipation?: No  Constitutional Fever: No Night sweats?: No Weight loss?: No Fatigue?: No  Skin Skin rash/lesions?: No Itching?: No  Eyes Blurred vision?: No Double vision?: No  Ears/Nose/Throat Sore throat?: No Sinus problems?: No  Hematologic/Lymphatic Swollen glands?: No Easy bruising?: No  Cardiovascular Leg swelling?: No Chest pain?:  No  Respiratory Cough?: No Shortness of breath?: No  Endocrine Excessive thirst?: No  Musculoskeletal Back pain?: Yes Joint pain?: No  Neurological Headaches?: No Dizziness?: No  Psychologic Depression?: Yes Anxiety?: Yes  Physical Exam: BP (!) 176/98 (BP Location: Right Arm, Patient Position: Sitting, Cuff Size: Large)   Pulse 75   Ht 5\' 10"  (1.778 m)   Wt 207 lb 9.6 oz (94.2 kg)   BMI 29.79 kg/m   Constitutional:  Alert and oriented, No acute distress. HEENT: [place] AT, moist mucus membranes.  Trachea midline, no masses. Cardiovascular: No clubbing, cyanosis, or edema. Respiratory: Normal respiratory effort, no increased work of breathing. GI: Abdomen is soft, nontender, nondistended, no abdominal masses GU: No CVA tenderness.  Normal phallus.  Right testicle benign.  He does have an apparent left epididymal cyst approximately 2-3 cm in size.  No obvious mass on testicle.  Skin: No rashes, bruises or suspicious lesions. Lymph: No cervical or inguinal adenopathy. Neurologic: Grossly intact, no focal deficits, moving all 4 extremities. Psychiatric: Normal mood and affect.  Laboratory Data: Lab Results  Component Value Date   WBC 6.5 08/13/2014   HGB 13.2 08/13/2014   HCT 38.3 (L) 08/13/2014   MCV 91.8 08/13/2014   PLT 252 08/13/2014    Lab Results  Component Value Date   CREATININE 0.93 03/15/2015    No results found for: PSA  No results found for: TESTOSTERONE  No results found for: HGBA1C  Urinalysis    Component Value Date/Time   COLORURINE YELLOW 05/09/2013 1539   APPEARANCEUR Clear 03/15/2015 1537   LABSPEC 1.020 05/09/2013 1539   PHURINE 6.0 05/09/2013 1539   GLUCOSEU Negative 03/15/2015 1537   GLUCOSEU NEGATIVE 05/09/2013 1539   HGBUR 1+ 05/09/2013 1539   BILIRUBINUR Negative 03/15/2015 1537   BILIRUBINUR NEGATIVE 05/09/2013 [place] 05/09/2013 1539   PROTEINUR Negative 03/15/2015 1537   PROTEINUR NEGATIVE 05/09/2013 1539    NITRITE Negative 03/15/2015 1537   NITRITE NEGATIVE 05/09/2013 1539   LEUKOCYTESUR Negative 03/15/2015 1537   LEUKOCYTESUR NEGATIVE 05/09/2013 1539    Assessment & Plan:    1 - Dysuria - Low suspicion for infectious etiology. More likely chronic pain spectrum problem but feel that completing hematuria eval also important to r/o bladder tumor as etiology.   2 - Prostate Screening - PSA today to complete eval.   3 - Microscopic hematuria - remains most concerning issue today as it has previously. Again discussed DDX of benign and malignant etiology and of significant more concern in prior 40PY smoker and suggested completion of hematuria work up. He will proceed  4 -Left epididymal cyst -reassured patient that this is extratesticular and not very concerning to be cancerous which is the main concern.  Will obtain a scrotal ultrasound to be sure.  5 - ED -patient is on nitrates.  He will discuss with his cardiologist if he can safely stop this.  He did have a recent cardiac cath for surveillance due to his history which was negative.  He is able to stop this medication, we can start him on generic sildenafil.  We also did discuss other erectile dysfunction treatments.  We will discuss this further at follow-up  Return for after scrotal u/s for cysto.  Nickie Retort, MD  [facility] [address] [place], [place] [address] [phone]

## 2017-02-08 ENCOUNTER — Telehealth: Payer: Self-pay

## 2017-02-08 LAB — PSA: PROSTATE SPECIFIC AG, SERUM: 0.3 ng/mL (ref 0.0–4.0)

## 2017-02-08 NOTE — Telephone Encounter (Signed)
Nickie Retort, MD  Lestine Box, LPN        Please let patient know PSA is normal. F/u as scheduled. Thanks    Will send a letter.

## 2017-02-28 ENCOUNTER — Other Ambulatory Visit: Payer: BLUE CROSS/BLUE SHIELD | Admitting: Urology

## 2017-03-14 ENCOUNTER — Encounter: Payer: Self-pay | Admitting: Urology

## 2017-03-14 ENCOUNTER — Ambulatory Visit (INDEPENDENT_AMBULATORY_CARE_PROVIDER_SITE_OTHER): Payer: BLUE CROSS/BLUE SHIELD | Admitting: Urology

## 2017-03-14 VITALS — BP 185/106 | HR 65 | Ht 70.0 in | Wt 206.8 lb

## 2017-03-14 DIAGNOSIS — R3129 Other microscopic hematuria: Secondary | ICD-10-CM | POA: Diagnosis not present

## 2017-03-14 LAB — URINALYSIS, COMPLETE
Bilirubin, UA: NEGATIVE
Glucose, UA: NEGATIVE
KETONES UA: NEGATIVE
Leukocytes, UA: NEGATIVE
NITRITE UA: NEGATIVE
Protein, UA: NEGATIVE
Specific Gravity, UA: <1.005 — ABNORMAL LOW (ref 1.005–1.030)
Urobilinogen, Ur: 0.2 mg/dL (ref 0.2–1.0)
pH, UA: 6 (ref 5.0–7.5)

## 2017-03-14 MED ORDER — CIPROFLOXACIN HCL 500 MG PO TABS
500.0000 mg | ORAL_TABLET | Freq: Once | ORAL | Status: AC
  Administered 2017-03-14: 500 mg via ORAL

## 2017-03-14 MED ORDER — LIDOCAINE HCL 2 % EX GEL
1.0000 "application " | Freq: Once | CUTANEOUS | Status: AC
  Administered 2017-03-14: 1 via URETHRAL

## 2017-03-14 NOTE — Progress Notes (Signed)
   03/14/17  CC:  Chief Complaint  Patient presents with  . Cysto    HPI: The patient is a 62 year old gentleman that presents today for microhematuria cystoscopy.  1 - Dysuria- pt with few "spells" of dysuria over several years. GC and UCX negative x several. 40PY smoker (now quit) wit microhematuira as per below.  Dysuria is intermittent.  2 - Prostate Screening- No FHX prostate cancer -up to date  3 - Microscopic Hematuria- pt with blood on UA at PCP and our office x several. Denies gross episodes. Prior 40PY smoker. CT in 2/17 without source for hematuria. Never followed up for cysto. No prior cysto, though reccomended x several. Presents for cysto today.  4 - Scrotal mass  -patient noticed a lump above his testicle approximately 1 week ago.  He is concerned that this might be testicular cancer.  He did not notice it before.  Is nontender to palpation.  He feels it is separate and above from his testicle.  It was felt to be an epididymal cyst.  Scrotal ultrasound was recommended but the patient did not have this performed as scheduled.  5 - ED  -patient reports that he is unable to maintain an erection.  Is able to obtain one however.  He has a good sex drive.  He has never tried medication for this before.  Unfortunately, he is on nitrates.  PMH sig for CAD/MI/Stent/Brilenta, No surgeries.   Today patient is today for cystopscopy    Blood pressure (!) 185/106, pulse 65, height 5\' 10"  (1.778 m), weight 206 lb 12.8 oz (93.8 kg). NED. A&Ox3.   No respiratory distress   Abd soft, NT, ND Normal phallus with bilateral descended testicles  Cystoscopy Procedure Note  Patient identification was confirmed, informed consent was obtained, and patient was prepped using Betadine solution.  Lidocaine jelly was administered per urethral meatus.    Preoperative abx where received prior to procedure.     Pre-Procedure: - Inspection reveals a normal caliber ureteral  meatus.  Procedure: The flexible cystoscope was introduced without difficulty - No urethral strictures/lesions are present. - Enlarged prostate Mild visual obstruction - Normal bladder neck - Bilateral ureteral orifices identified - Bladder mucosa  reveals no ulcers, tumors, or lesions - No bladder stones - No trabeculation  Retroflexion shows no intravesical lobe   Post-Procedure: - Patient tolerated the procedure well  Assessment/ Plan:  1 - Dysuria- Low suspicion for infectious etiology. More likely chronic pain spectrum problem.  Urine sent for cytology today due to chronic dysuria.  Since patient mainly asymptomatic at this point, he will return to there is a flare for cultures.  2 - Prostate Screening- up to date  3 - Microscopic hematuria- Negative work up. Repeat urinalysis in one year  4 -Left epididymal cyst -recommended patient pursue scrotal ultrasound.  He plans to do this in the near future.  5 - ED -patient is on nitrates.  He will discuss with his cardiologist if he can safely stop this.  He did have a recent cardiac cath for surveillance due to his history which was negative.  If he is able to stop this medication, we can start him on generic sildenafil.  We also did discuss other erectile dysfunction treatments.

## 2017-03-27 ENCOUNTER — Other Ambulatory Visit: Payer: Self-pay

## 2017-03-27 DIAGNOSIS — K219 Gastro-esophageal reflux disease without esophagitis: Secondary | ICD-10-CM

## 2017-03-27 MED ORDER — DEXLANSOPRAZOLE 60 MG PO CPDR: 60.0000 mg | DELAYED_RELEASE_CAPSULE | Freq: Every day | ORAL | 3 refills | Status: DC

## 2017-03-28 ENCOUNTER — Other Ambulatory Visit: Payer: BLUE CROSS/BLUE SHIELD

## 2017-04-26 ENCOUNTER — Other Ambulatory Visit
Admission: RE | Admit: 2017-04-26 | Discharge: 2017-04-26 | Disposition: A | Discharge status: Home / Self Care | Payer: BLUE CROSS/BLUE SHIELD | Source: Ambulatory Visit | Attending: Gastroenterology | Admitting: Gastroenterology

## 2017-04-26 DIAGNOSIS — K2970 Gastritis, unspecified, without bleeding: Secondary | ICD-10-CM | POA: Diagnosis not present

## 2017-04-27 LAB — H. PYLORI ANTIGEN, STOOL: H. PYLORI STOOL AG, EIA: NEGATIVE

## 2017-05-07 ENCOUNTER — Other Ambulatory Visit: Payer: Self-pay | Admitting: General Surgery

## 2017-05-08 ENCOUNTER — Other Ambulatory Visit: Payer: Self-pay | Admitting: General Surgery

## 2017-05-08 DIAGNOSIS — N6310 Unspecified lump in the right breast, unspecified quadrant: Secondary | ICD-10-CM

## 2017-05-21 ENCOUNTER — Other Ambulatory Visit: Payer: BLUE CROSS/BLUE SHIELD

## 2017-05-24 ENCOUNTER — Other Ambulatory Visit: Payer: BLUE CROSS/BLUE SHIELD

## 2017-05-31 ENCOUNTER — Other Ambulatory Visit: Payer: BLUE CROSS/BLUE SHIELD

## 2017-12-12 ENCOUNTER — Ambulatory Visit: Payer: BLUE CROSS/BLUE SHIELD | Admitting: Family Medicine

## 2017-12-12 VITALS — BP 161/92 | HR 70 | Ht 70.0 in | Wt 206.0 lb

## 2017-12-12 DIAGNOSIS — R300 Dysuria: Secondary | ICD-10-CM

## 2017-12-12 LAB — URINALYSIS, COMPLETE
BILIRUBIN UA: NEGATIVE
GLUCOSE, UA: NEGATIVE
KETONES UA: NEGATIVE
Leukocytes, UA: NEGATIVE
Nitrite, UA: NEGATIVE
Protein, UA: NEGATIVE
RBC, UA: NEGATIVE
Specific Gravity, UA: <1.005 — ABNORMAL LOW (ref 1.005–1.030)
UUROB: 0.2 mg/dL (ref 0.2–1.0)
pH, UA: 7 (ref 5.0–7.5)

## 2017-12-12 NOTE — Progress Notes (Signed)
Patient presents today with complaints of dysuria for 3 weeks. A urine was collected for UA, UCX. Patient states he has not been on ABX or had any Urological surgeries in the last 30 days. After reviewing UA no ABX was sent at this time.

## 2017-12-15 LAB — CULTURE, URINE COMPREHENSIVE

## 2018-01-13 ENCOUNTER — Ambulatory Visit: Payer: BLUE CROSS/BLUE SHIELD | Admitting: Urology

## 2018-01-24 ENCOUNTER — Ambulatory Visit: Payer: BLUE CROSS/BLUE SHIELD | Admitting: Urology

## 2018-01-27 ENCOUNTER — Ambulatory Visit: Payer: BLUE CROSS/BLUE SHIELD | Admitting: Urology

## 2018-03-13 ENCOUNTER — Ambulatory Visit (INDEPENDENT_AMBULATORY_CARE_PROVIDER_SITE_OTHER): Payer: PRIVATE HEALTH INSURANCE | Admitting: Urology

## 2018-03-13 ENCOUNTER — Encounter: Payer: Self-pay | Admitting: Urology

## 2018-03-13 VITALS — BP 150/82 | HR 71 | Ht 70.0 in | Wt 206.6 lb

## 2018-03-13 DIAGNOSIS — N4889 Other specified disorders of penis: Secondary | ICD-10-CM | POA: Diagnosis not present

## 2018-03-13 DIAGNOSIS — Z125 Encounter for screening for malignant neoplasm of prostate: Secondary | ICD-10-CM | POA: Diagnosis not present

## 2018-03-13 LAB — URINALYSIS, COMPLETE
Bilirubin, UA: NEGATIVE
Glucose, UA: NEGATIVE
Ketones, UA: NEGATIVE
Leukocytes, UA: NEGATIVE
Nitrite, UA: NEGATIVE
Protein, UA: NEGATIVE
Specific Gravity, UA: <1.005 — ABNORMAL LOW (ref 1.005–1.030)
Urobilinogen, Ur: 0.2 mg/dL (ref 0.2–1.0)
pH, UA: 6 (ref 5.0–7.5)

## 2018-03-13 MED ORDER — TAMSULOSIN HCL 0.4 MG PO CAPS: 0.4000 mg | ORAL_CAPSULE | Freq: Every day | ORAL | 0 refills | Status: DC

## 2018-03-13 NOTE — Progress Notes (Signed)
03/13/2018  3:29 PM   William Conway [birth year] [id number]  Referring provider: [name], MD [place], [place] [address]  Chief Complaint  Patient presents with  . Dysuria    HPI: William Conway is a 63 yo M who returns today for a 1 year f/u for the evaluation and management of microscopic hematuria.   - Previously followed by Dr. Pilar Jarvis  - Patient expressed concerns for adrenal adenoma previous visualized on 2017 CT Abd/Pelvis w/wo contrast.  He states Dr. Ellison Hughs has ordered a follow-up CT.  - Patient reports intermittent burning pain at the tip of his penis that is aggravated by certain fabrics but not effected by physical touch  - Denies dysuria, gross hematuria or flank/abdominal/pelvic/scrotal pain.   PMH: Past Medical History:  Diagnosis Date  . Allergic rhinitis 11/17/2014  . Coronary artery disease   . Familial multiple lipoprotein-type hyperlipidemia 06/21/2014  . Heart attack ([place]) 07/06/2014   Overview:  A. 2009: Perfusion Scan Normal B. STEMI: Cardiac Cath: EF 65%. Acute occlusion RCA, nonobstructive LAD, LCX 5/16 with [facility] Scientific Promus 3X32 DES   . History of multiple pulmonary nodules 11/17/2014   Stable nodules 3 nodules 02/26/13   . Mood disorder of depressed type 11/17/2014  . Myocardial infarction ([place]) 06/20/2014   Overview:  A. 2009: Perfusion Scan Normal B. STEMI: Cardiac Cath: EF 65%. Acute occlusion RCA, nonobstructive LAD, LCX 5/16 with [facility] Scientific Promus 3X32 DES   . Obstructive apnea 11/17/2014   Overview:  Overview:  Intolerant of CPAP   . OSA (obstructive sleep apnea) 11/17/2014   Intolerant of CPAP   . Seasonal and perennial allergic rhinitis 11/17/2014  . ST elevation myocardial infarction (STEMI) ([place]) 07/06/2014   Overview:  Overview:  Overview:  A. 2009: Perfusion Scan Normal B. STEMI: Cardiac Cath: EF 65%. Acute occlusion RCA, nonobstructive LAD, LCX 5/16 with [facility] [employer] Promus [phone] DES     Surgical  History: Past Surgical History:  Procedure Laterality Date  . CORONARY ANGIOPLASTY WITH STENT PLACEMENT  06/2014   to RCA  . LEFT HEART CATH AND CORONARY ANGIOGRAPHY N/A 01/02/2017   Procedure: LEFT HEART CATH AND CORONARY ANGIOGRAPHY;  Surgeon: Isaias Cowman, MD;  Location: [facility];  Service: Cardiovascular;  Laterality: N/A;    Home Medications:  Allergies as of 03/13/2018      Reactions   Atorvastatin Other (See Comments)   Muscle Pain   Procaine Other (See Comments)   Unknown      Medication List       Accurate as of March 13, 2018  3:29 PM. Always use your most recent med list.        acetaminophen 500 MG tablet Commonly known as:  TYLENOL Take 1,000 mg every 6 (six) hours as needed by mouth for moderate pain or headache.   aspirin EC 81 MG tablet Take by mouth.   buPROPion 150 MG 24 hr tablet Commonly known as:  WELLBUTRIN XL Take 150 mg daily by mouth.   clopidogrel 75 MG tablet Commonly known as:  PLAVIX Take 75 mg daily by mouth.   dexlansoprazole 60 MG capsule Commonly known as:  DEXILANT Take 1 capsule (60 mg total) by mouth daily.   furosemide 20 MG tablet Commonly known as:  LASIX   gabapentin 300 MG capsule Commonly known as:  NEURONTIN   levofloxacin 250 MG tablet Commonly known as:  LEVAQUIN   losartan 25 MG tablet Commonly known as:  COZAAR  Take 25 mg daily by mouth.   meclizine 25 MG tablet Commonly known as:  ANTIVERT Take 1 tablet (25 mg total) by mouth 3 (three) times daily as needed for dizziness.   metoprolol tartrate 25 MG tablet Commonly known as:  LOPRESSOR Take 25 mg daily by mouth.   nitroGLYCERIN 0.4 MG SL tablet Commonly known as:  NITROSTAT Place 0.4 mg every 5 (five) minutes as needed under the tongue for chest pain.   predniSONE 10 MG tablet Commonly known as:  DELTASONE TAKE BY MOUTH AS DIRECTED 6 DAY TAPER 6 5 4 3 2 1    rosuvastatin 5 MG tablet Commonly known as:  CRESTOR Take 5 mg 2  (two) times a week by mouth.   sertraline 100 MG tablet Commonly known as:  ZOLOFT Take 100 mg by mouth daily.   TACLONEX external suspension Generic drug:  calcipotriene-betamethasone Apply 1 application as needed topically (for flare ups).   traMADol 50 MG tablet Commonly known as:  ULTRAM       Allergies:  Allergies  Allergen Reactions  . Atorvastatin Other (See Comments)    Muscle Pain  . Procaine Other (See Comments)    Unknown    Family History: Family History  Problem Relation Age of Onset  . Hypertension Brother   . CAD Father   . Prostate cancer Neg Hx     Social History:  reports that he quit smoking about 3 years ago. He has never used smokeless tobacco. He reports current alcohol use of about 4.0 standard drinks of alcohol per week. No history on file for drug.  ROS: UROLOGY Frequent Urination?: No Hard to postpone urination?: Yes Burning/pain with urination?: Yes Get up at night to urinate?: No Leakage of urine?: No Urine stream starts and stops?: No Trouble starting stream?: No Do you have to strain to urinate?: No Blood in urine?: No Urinary tract infection?: No Sexually transmitted disease?: No Injury to kidneys or bladder?: No Painful intercourse?: No Weak stream?: No Erection problems?: No Penile pain?: Yes  Gastrointestinal Nausea?: No Vomiting?: No Indigestion/heartburn?: No Diarrhea?: No Constipation?: No  Constitutional Fever: No Night sweats?: No Weight loss?: No Fatigue?: No  Skin Skin rash/lesions?: No Itching?: No  Eyes Blurred vision?: No Double vision?: No  Ears/Nose/Throat Sore throat?: No Sinus problems?: Yes  Hematologic/Lymphatic Swollen glands?: No Easy bruising?: No  Cardiovascular Leg swelling?: No Chest pain?: No  Respiratory Cough?: No Shortness of breath?: No  Endocrine Excessive thirst?: No  Musculoskeletal Back pain?: Yes Joint pain?: No  Neurological Headaches?: No Dizziness?:  No  Psychologic Depression?: No Anxiety?: No  Physical Exam: BP (!) 150/82 (BP Location: Left Arm, Patient Position: Sitting, Cuff Size: Normal)   Pulse 71   Ht 5\' 10"  (1.778 m)   Wt 206 lb 9.6 oz (93.7 kg)   BMI 29.64 kg/m   Constitutional:  Alert and oriented, No acute distress. Respiratory: Normal respiratory effort, no increased work of breathing. Head: Normocephalic and Atraumatic GU: Declined DRE Skin: No rashes, bruises or suspicious lesions. Neurologic: Grossly intact, no focal deficits, moving all 4 extremities. Psychiatric: Normal mood and affect.   Laboratory Data: Urinalysis Microscopic Exam  Ref Range and Units  WBC None 0-5/hpf  RBC None 0-2/hpf  Epithelial Cells None 0-10/hpf (non-renal)  Renal Epithelial Cells None /hpf  Casts None None seen/lpf  Cast Type None   Crystals None None seen/lpf  Crystal Type None   Mucus Threads None Not established/lp  Bacteria None None to few seen  Yeast None None seen  Trichomonas None None seen  Dipstick Results    NIT Negative Negative  LEU Negative Negative    Assessment & Plan:    1. Microscopic hematuria   - No microhematuria visualized on urinalysis today  2. Prostate Cancer Screening  -  Discussed AUA guidelines for prostate cancer screening and he desires PSA testing  - PSA today, will call patient with results  3. Right Adrenal Adenoma  - Previously visualized on 03/2015 CT, no follow up imaging since  -CT has been ordered by Dr. Ellison Hughs.  4. Penile Pain  -May be referred pain secondary to inflammatory prostatitis.  -Trial tamsulosin 0.4 mg #30     Abbie Sons, MD [place] [address], [place], [place] [address] [phone]  I, [phone] Renne Crigler , am acting as a scribe for Abbie Sons, MD  I, Abbie Sons, MD, have reviewed all documentation for this visit. The documentation on 03/14/18 for the exam, diagnosis, procedures, and  orders are all accurate and complete.

## 2018-03-13 NOTE — Progress Notes (Signed)
Error

## 2018-03-14 ENCOUNTER — Encounter: Payer: Self-pay | Admitting: Urology

## 2018-03-14 LAB — PSA: Prostate Specific Ag, Serum: 0.4 ng/mL (ref 0.0–4.0)

## 2018-03-17 ENCOUNTER — Telehealth: Payer: Self-pay

## 2018-03-17 NOTE — Telephone Encounter (Signed)
-----   Message from Abbie Sons, MD sent at 03/17/2018  1:00 AM EST ----- PSA normal and stable at 0.4

## 2018-03-17 NOTE — Telephone Encounter (Signed)
Called pt's number, spouse answered, gave spouse results as she is listed on DPR.

## 2018-03-19 ENCOUNTER — Other Ambulatory Visit: Payer: Self-pay | Admitting: Family Medicine

## 2018-03-19 DIAGNOSIS — E278 Other specified disorders of adrenal gland: Secondary | ICD-10-CM

## 2018-03-19 DIAGNOSIS — E279 Disorder of adrenal gland, unspecified: Principal | ICD-10-CM

## 2018-03-27 ENCOUNTER — Ambulatory Visit: Admission: RE | Admit: 2018-03-27 | Payer: PRIVATE HEALTH INSURANCE | Source: Ambulatory Visit

## 2018-04-11 ENCOUNTER — Other Ambulatory Visit: Payer: Self-pay | Admitting: Gastroenterology

## 2018-04-11 DIAGNOSIS — K219 Gastro-esophageal reflux disease without esophagitis: Secondary | ICD-10-CM

## 2018-08-26 ENCOUNTER — Other Ambulatory Visit: Payer: Self-pay

## 2018-08-26 ENCOUNTER — Ambulatory Visit (INDEPENDENT_AMBULATORY_CARE_PROVIDER_SITE_OTHER): Payer: PRIVATE HEALTH INSURANCE | Admitting: Urology

## 2018-08-26 ENCOUNTER — Encounter: Payer: Self-pay | Admitting: Urology

## 2018-08-26 VITALS — BP 158/93 | HR 65 | Ht 70.0 in | Wt 200.0 lb

## 2018-08-26 DIAGNOSIS — N529 Male erectile dysfunction, unspecified: Secondary | ICD-10-CM | POA: Diagnosis not present

## 2018-08-26 MED ORDER — SILDENAFIL CITRATE 20 MG PO TABS: 20.0000 mg | ORAL_TABLET | ORAL | 6 refills | Status: DC | PRN

## 2018-08-26 NOTE — Patient Instructions (Signed)
Erectile Dysfunction Erectile dysfunction (ED) is the inability to get or keep an erection in order to have sexual intercourse. Erectile dysfunction may include:  Inability to get an erection.  Lack of enough hardness of the erection to allow penetration.  Loss of the erection before sex is finished. What are the causes? This condition may be caused by:  Certain medicines, such as: ? Pain relievers. ? Antihistamines. ? Antidepressants. ? Blood pressure medicines. ? Water pills (diuretics). ? Ulcer medicines. ? Muscle relaxants. ? Drugs.  Excessive drinking.  Psychological causes, such as: ? Anxiety. ? Depression. ? Sadness. ? Exhaustion. ? Performance fear. ? Stress.  Physical causes, such as: ? Artery problems. This may include diabetes, smoking, liver disease, or atherosclerosis. ? High blood pressure. ? Hormonal problems, such as low testosterone. ? Obesity. ? Nerve problems. This may include back or pelvic injuries, diabetes mellitus, multiple sclerosis, or Parkinson disease. What are the signs or symptoms? Symptoms of this condition include:  Inability to get an erection.  Lack of enough hardness of the erection to allow penetration.  Loss of the erection before sex is finished.  Normal erections at some times, but with frequent unsatisfactory episodes.  Low sexual satisfaction in either partner due to erection problems.  A curved penis occurring with erection. The curve may cause pain or the penis may be too curved to allow for intercourse.  Never having nighttime erections. How is this diagnosed? This condition is often diagnosed by:  Performing a physical exam to find other diseases or specific problems with the penis.  Asking you detailed questions about the problem.  Performing blood tests to check for diabetes mellitus or to measure hormone levels.  Performing other tests to check for underlying health conditions.  Performing an ultrasound  exam to check for scarring.  Performing a test to check blood flow to the penis.  Doing a sleep study at home to measure nighttime erections. How is this treated? This condition may be treated by:  Medicine taken by mouth to help you achieve an erection (oral medicine).  Hormone replacement therapy to replace low testosterone levels.  Medicine that is injected into the penis. Your health care provider may instruct you how to give yourself these injections at home.  Vacuum pump. This is a pump with a ring on it. The pump and ring are placed on the penis and used to create pressure that helps the penis become erect.  Penile implant surgery. In this procedure, you may receive: ? An inflatable implant. This consists of cylinders, a pump, and a reservoir. The cylinders can be inflated with a fluid that helps to create an erection, and they can be deflated after intercourse. ? A semi-rigid implant. This consists of two silicone rubber rods. The rods provide some rigidity. They are also flexible, so the penis can both curve downward in its normal position and become straight for sexual intercourse.  Blood vessel surgery, to improve blood flow to the penis. During this procedure, a blood vessel from a different part of the body is placed into the penis to allow blood to flow around (bypass) damaged or blocked blood vessels.  Lifestyle changes, such as exercising more, losing weight, and quitting smoking. Follow these instructions at home: Medicines   Take over-the-counter and prescription medicines only as told by your health care provider. Do not increase the dosage without first discussing it with your health care provider.  If you are using self-injections, perform injections as directed by your   health care provider. Make sure to avoid any veins that are on the surface of the penis. After giving an injection, apply pressure to the injection site for 5 minutes. General instructions   Exercise regularly, as directed by your health care provider. Work with your health care provider to lose weight, if needed.  Do not use any products that contain nicotine or tobacco, such as cigarettes and e-cigarettes. If you need help quitting, ask your health care provider.  Before using a vacuum pump, read the instructions that come with the pump and discuss any questions with your health care provider.  Keep all follow-up visits as told by your health care provider. This is important. Contact a health care provider if:  You feel nauseous.  You vomit. Get help right away if:  You are taking oral or injectable medicines and you have an erection that lasts longer than 4 hours. If your health care provider is unavailable, go to the nearest emergency room for evaluation. An erection that lasts much longer than 4 hours can result in permanent damage to your penis.  You have severe pain in your groin or abdomen.  You develop redness or severe swelling of your penis.  You have redness spreading up into your groin or lower abdomen.  You are unable to urinate.  You experience chest pain or a rapid heart beat (palpitations) after taking oral medicines. Summary  Erectile dysfunction (ED) is the inability to get or keep an erection during sexual intercourse. This problem can usually be treated successfully.  This condition is diagnosed based on a physical exam, your symptoms, and tests to determine the cause. Treatment varies depending on the cause, and may include medicines, hormone therapy, surgery, or vacuum pump.  You may need follow-up visits to make sure that you are using your medicines or devices correctly.  Get help right away if you are taking or injecting medicines and you have an erection that lasts longer than 4 hours. This information is not intended to replace advice given to you by your health care provider. Make sure you discuss any questions you have with your health care  provider. Document Released: 01/27/2000 Document Revised: 01/11/2017 Document Reviewed: 02/15/2016 Elsevier Patient Education  2020 Elsevier Inc.  

## 2018-08-26 NOTE — Progress Notes (Signed)
   08/26/2018 2:13 PM   Frazier Butt 03/28/1955 [id number]  Reason for visit: ED  HPI: I saw Mr. Anderson in urology clinic today for discussion of erectile dysfunction.  He was previously followed by Dr. Bernardo Heater for dysuria, which subsequently resolved.  He is a 63 year old man with a cardiac history and history of MI with cardiac stent placement 4 years ago.  He has taken nitrates twice in the 4 years since then.  He denies any shortness of breath or chest pain with physical activity or climbing 2 flights of stairs.  He reports greater than 64-month history of worsening erections with difficulty maintaining an erection.  He has never tried any medications for this before.  There are no aggravating or alleviating factors.  Severity is mild to moderate.  He had a cardiac catheterization in November 2018 which showed insignificant CAD and a patent stent in the proximal RCA.  ROS: Please see flowsheet from today's date for complete review of systems.  Physical Exam: BP (!) 158/93 (BP Location: Left Arm, Patient Position: Sitting)   Pulse 65   Ht 5\' 10"  (1.778 m)   Wt 200 lb (90.7 kg)   BMI 28.70 kg/m     Laboratory Data: 02/2018: PSA 0.4  Assessment & Plan:   In summary, the patient is a 63 year old male with cardiac history who presents with erectile dysfunction.  We had a very lengthy conversation about the risks of PDE 5 inhibitors in the setting of CAD and nitrates.  I stressed at length the importance of never combining these medications.  We discussed other alternatives for erectile dysfunction including vacuum device, Muse, and penile injections.  He would like to trial a low-dose PDE 5 inhibitor.  We discussed the risks at length including changes in vision, headache, and nasal congestion.  He understands the risks of combining this with nitrates with an unsafe drop in blood pressure.  Trial of Revatio 20 to 60 mg on demand.  Do not take with nitrates for chest pain.  Follow-up as needed  A total of 15 minutes were spent face-to-face with the patient, greater than 50% was spent in patient education, counseling, and coordination of care regarding erectile dysfunction.   Billey Co, [facility] [address] [place], [place] [address] [phone]

## 2018-09-01 ENCOUNTER — Other Ambulatory Visit: Payer: Self-pay | Admitting: Neurology

## 2018-09-01 DIAGNOSIS — R259 Unspecified abnormal involuntary movements: Secondary | ICD-10-CM

## 2018-09-10 ENCOUNTER — Ambulatory Visit: Payer: PRIVATE HEALTH INSURANCE

## 2018-10-14 DEATH — deceased

## 2018-12-02 ENCOUNTER — Other Ambulatory Visit: Payer: Self-pay | Admitting: Family Medicine

## 2018-12-02 DIAGNOSIS — R1013 Epigastric pain: Secondary | ICD-10-CM

## 2018-12-02 DIAGNOSIS — R0789 Other chest pain: Secondary | ICD-10-CM

## 2018-12-02 DIAGNOSIS — G8929 Other chronic pain: Secondary | ICD-10-CM

## 2019-07-24 ENCOUNTER — Other Ambulatory Visit: Payer: Self-pay | Admitting: Gerontology

## 2019-07-24 DIAGNOSIS — E278 Other specified disorders of adrenal gland: Secondary | ICD-10-CM

## 2019-07-24 DIAGNOSIS — R300 Dysuria: Secondary | ICD-10-CM

## 2019-07-27 ENCOUNTER — Telehealth: Payer: Self-pay | Admitting: Radiology

## 2020-11-28 DIAGNOSIS — I779 Disorder of arteries and arterioles, unspecified: Secondary | ICD-10-CM | POA: Diagnosis not present

## 2020-11-28 DIAGNOSIS — R251 Tremor, unspecified: Secondary | ICD-10-CM | POA: Diagnosis not present

## 2020-11-28 DIAGNOSIS — R519 Headache, unspecified: Secondary | ICD-10-CM | POA: Diagnosis not present

## 2020-11-28 DIAGNOSIS — M542 Cervicalgia: Secondary | ICD-10-CM | POA: Diagnosis not present

## 2020-11-28 DIAGNOSIS — G4733 Obstructive sleep apnea (adult) (pediatric): Secondary | ICD-10-CM | POA: Diagnosis not present

## 2021-01-19 DIAGNOSIS — R251 Tremor, unspecified: Secondary | ICD-10-CM | POA: Diagnosis not present

## 2021-01-19 DIAGNOSIS — I779 Disorder of arteries and arterioles, unspecified: Secondary | ICD-10-CM | POA: Diagnosis not present

## 2021-01-19 DIAGNOSIS — R9082 White matter disease, unspecified: Secondary | ICD-10-CM | POA: Diagnosis not present

## 2021-02-01 DIAGNOSIS — E782 Mixed hyperlipidemia: Secondary | ICD-10-CM | POA: Diagnosis not present

## 2021-02-01 DIAGNOSIS — Z23 Encounter for immunization: Secondary | ICD-10-CM | POA: Diagnosis not present

## 2021-02-01 DIAGNOSIS — I2119 ST elevation (STEMI) myocardial infarction involving other coronary artery of inferior wall: Secondary | ICD-10-CM | POA: Diagnosis not present

## 2021-02-01 DIAGNOSIS — I2510 Atherosclerotic heart disease of native coronary artery without angina pectoris: Secondary | ICD-10-CM | POA: Diagnosis not present

## 2021-02-01 DIAGNOSIS — G4733 Obstructive sleep apnea (adult) (pediatric): Secondary | ICD-10-CM | POA: Diagnosis not present

## 2021-02-01 DIAGNOSIS — I2111 ST elevation (STEMI) myocardial infarction involving right coronary artery: Secondary | ICD-10-CM | POA: Diagnosis not present

## 2021-02-16 DIAGNOSIS — M25312 Other instability, left shoulder: Secondary | ICD-10-CM | POA: Diagnosis not present

## 2021-02-16 DIAGNOSIS — I779 Disorder of arteries and arterioles, unspecified: Secondary | ICD-10-CM | POA: Diagnosis not present

## 2021-02-16 DIAGNOSIS — M7552 Bursitis of left shoulder: Secondary | ICD-10-CM | POA: Diagnosis not present

## 2021-02-28 DIAGNOSIS — K219 Gastro-esophageal reflux disease without esophagitis: Secondary | ICD-10-CM | POA: Diagnosis not present

## 2021-02-28 DIAGNOSIS — R300 Dysuria: Secondary | ICD-10-CM | POA: Diagnosis not present

## 2021-03-20 DIAGNOSIS — Z1211 Encounter for screening for malignant neoplasm of colon: Secondary | ICD-10-CM | POA: Diagnosis not present

## 2021-03-20 DIAGNOSIS — R198 Other specified symptoms and signs involving the digestive system and abdomen: Secondary | ICD-10-CM | POA: Diagnosis not present

## 2021-03-20 DIAGNOSIS — R1013 Epigastric pain: Secondary | ICD-10-CM | POA: Diagnosis not present

## 2021-03-20 DIAGNOSIS — K219 Gastro-esophageal reflux disease without esophagitis: Secondary | ICD-10-CM | POA: Diagnosis not present

## 2021-03-21 ENCOUNTER — Other Ambulatory Visit: Payer: Self-pay | Admitting: Gastroenterology

## 2021-03-21 ENCOUNTER — Other Ambulatory Visit (HOSPITAL_COMMUNITY): Payer: Self-pay | Admitting: Gastroenterology

## 2021-03-21 DIAGNOSIS — R198 Other specified symptoms and signs involving the digestive system and abdomen: Secondary | ICD-10-CM

## 2021-03-28 DIAGNOSIS — I2111 ST elevation (STEMI) myocardial infarction involving right coronary artery: Secondary | ICD-10-CM | POA: Diagnosis not present

## 2021-03-28 DIAGNOSIS — I2510 Atherosclerotic heart disease of native coronary artery without angina pectoris: Secondary | ICD-10-CM | POA: Diagnosis not present

## 2021-03-28 DIAGNOSIS — I2119 ST elevation (STEMI) myocardial infarction involving other coronary artery of inferior wall: Secondary | ICD-10-CM | POA: Diagnosis not present

## 2021-03-30 DIAGNOSIS — I2510 Atherosclerotic heart disease of native coronary artery without angina pectoris: Secondary | ICD-10-CM | POA: Diagnosis not present

## 2021-03-30 DIAGNOSIS — E7849 Other hyperlipidemia: Secondary | ICD-10-CM | POA: Diagnosis not present

## 2021-03-30 DIAGNOSIS — Z87891 Personal history of nicotine dependence: Secondary | ICD-10-CM | POA: Diagnosis not present

## 2021-03-30 DIAGNOSIS — R0602 Shortness of breath: Secondary | ICD-10-CM | POA: Diagnosis not present

## 2021-03-30 DIAGNOSIS — E782 Mixed hyperlipidemia: Secondary | ICD-10-CM | POA: Diagnosis not present

## 2021-03-30 DIAGNOSIS — I2111 ST elevation (STEMI) myocardial infarction involving right coronary artery: Secondary | ICD-10-CM | POA: Diagnosis not present

## 2021-03-30 DIAGNOSIS — I2119 ST elevation (STEMI) myocardial infarction involving other coronary artery of inferior wall: Secondary | ICD-10-CM | POA: Diagnosis not present

## 2021-04-03 ENCOUNTER — Ambulatory Visit
Admission: RE | Admit: 2021-04-03 | Discharge: 2021-04-03 | Disposition: A | Discharge status: Home / Self Care | Payer: Medicare HMO | Source: Ambulatory Visit | Attending: Gastroenterology | Admitting: Gastroenterology

## 2021-04-03 ENCOUNTER — Other Ambulatory Visit: Payer: Self-pay

## 2021-04-03 DIAGNOSIS — R198 Other specified symptoms and signs involving the digestive system and abdomen: Secondary | ICD-10-CM | POA: Diagnosis not present

## 2021-04-03 DIAGNOSIS — K760 Fatty (change of) liver, not elsewhere classified: Secondary | ICD-10-CM | POA: Diagnosis not present

## 2021-04-03 DIAGNOSIS — R109 Unspecified abdominal pain: Secondary | ICD-10-CM | POA: Diagnosis not present

## 2021-04-03 DIAGNOSIS — I700 Atherosclerosis of aorta: Secondary | ICD-10-CM | POA: Diagnosis not present

## 2021-04-03 LAB — POCT I-STAT CREATININE: Creatinine, Ser: 0.9 mg/dL (ref 0.61–1.24)

## 2021-04-03 MED ORDER — IOHEXOL 300 MG/ML  SOLN
100.0000 mL | Freq: Once | INTRAMUSCULAR | Status: AC | PRN
  Administered 2021-04-03: 100 mL via INTRAVENOUS

## 2021-04-13 ENCOUNTER — Ambulatory Visit: Payer: Medicare HMO | Admitting: Urology

## 2021-04-13 ENCOUNTER — Encounter: Payer: Self-pay | Admitting: Urology

## 2021-04-13 ENCOUNTER — Other Ambulatory Visit: Payer: Self-pay

## 2021-04-13 VITALS — BP 169/98 | HR 64 | Ht 70.0 in | Wt 198.0 lb

## 2021-04-13 DIAGNOSIS — N400 Enlarged prostate without lower urinary tract symptoms: Secondary | ICD-10-CM

## 2021-04-13 DIAGNOSIS — N5201 Erectile dysfunction due to arterial insufficiency: Secondary | ICD-10-CM

## 2021-04-13 NOTE — Progress Notes (Signed)
? ?04/13/2021 ?8:27 AM  ? ?William Conway ?05-18-1955 ?[id number] ? ?Primary provider: [name], MD ?[facility] ?[place],  [place] [address] ? ?Chief Complaint  ?Patient presents with  ? Other  ? ? ?HPI: ?66 y.o. male called for an appointment to discuss prostate concerns. ? ?Previously seen here by various providers for microscopic hematuria, dysuria and ED ?Had cystoscopy and CTU 20/18/2019 for microscopic hematuria without significant abnormalities identified on CTU/cystoscopy ?Saw PCP 02/28/2021 for dysuria.  Urinalysis was unremarkable.  He was treated with an empiric 14-day course of Levaquin.  Urine culture was negative ?His dysuria resolved ?Denies gross hematuria or flank, abdominal, pelvic pain ?Last PSA 07/23/2019 was 0.41 ?Has taken sildenafil in the past for ED ? ? ?PMH: ?Past Medical History:  ?Diagnosis Date  ? Allergic rhinitis 11/17/2014  ? Coronary artery disease   ? Familial multiple lipoprotein-type hyperlipidemia 06/21/2014  ? Heart attack ([place]) 07/06/2014  ? Overview:  A. 2009: Perfusion Scan Normal B. STEMI: Cardiac Cath: EF 65%. Acute occlusion RCA, nonobstructive LAD, LCX 5/16 with [place] [phone] DES   ? History of multiple pulmonary nodules 11/17/2014  ? Stable nodules 3 nodules 02/26/13   ? Mood disorder of depressed type 11/17/2014  ? Myocardial infarction ([place]) 06/20/2014  ? Overview:  A. 2009: Perfusion Scan Normal B. STEMI: Cardiac Cath: EF 65%. Acute occlusion RCA, nonobstructive LAD, LCX 5/16 with [place] [phone] DES   ? Obstructive apnea 11/17/2014  ? Overview:  Overview:  Intolerant of CPAP   ? OSA (obstructive sleep apnea) 11/17/2014  ? Intolerant of CPAP   ? Seasonal and perennial allergic rhinitis 11/17/2014  ? ST elevation myocardial infarction (STEMI) ([place]) 07/06/2014  ? Overview:  Overview:  Overview:  A. 2009: Perfusion Scan Normal B. STEMI: Cardiac Cath: EF 65%. Acute occlusion RCA, nonobstructive LAD, LCX 5/16 with [place] [phone] DES    ? ? ?Surgical History: ?Past Surgical History:  ?Procedure Laterality Date  ? CORONARY ANGIOPLASTY WITH STENT PLACEMENT  06/2014  ? to RCA  ? LEFT HEART CATH AND CORONARY ANGIOGRAPHY N/A 01/02/2017  ? Procedure: LEFT HEART CATH AND CORONARY ANGIOGRAPHY;  Surgeon: Isaias Cowman, MD;  Location: [facility];  Service: Cardiovascular;  Laterality: N/A;  ? ? ?Home Medications:  ?Allergies as of 04/13/2021   ? ?   Reactions  ? Atorvastatin Other (See Comments)  ? Muscle Pain  ? Procaine Other (See Comments)  ? Unknown  ? ?  ? ?  ?Medication List  ?  ? ?  ? Accurate as of April 13, 2021  8:27 AM. If you have any questions, ask your nurse or doctor.  ?  ?  ? ?  ? ?STOP taking these medications   ? ?acetaminophen 500 MG tablet ?Commonly known as: TYLENOL ?Stopped by: Abbie Sons, MD ?  ?dexlansoprazole 60 MG capsule ?Commonly known as: Dexilant ?Stopped by: Abbie Sons, MD ?  ?furosemide 20 MG tablet ?Commonly known as: LASIX ?Stopped by: Abbie Sons, MD ?  ?gabapentin 300 MG capsule ?Commonly known as: NEURONTIN ?Stopped by: Abbie Sons, MD ?  ?levofloxacin 250 MG tablet ?Commonly known as: LEVAQUIN ?Stopped by: Abbie Sons, MD ?  ?predniSONE 10 MG tablet ?Commonly known as: DELTASONE ?Stopped by: Abbie Sons, MD ?  ?traMADol 50 MG tablet ?Commonly known as: ULTRAM ?Stopped by: Abbie Sons, MD ?  ? ?  ? ?TAKE these medications   ? ?buPROPion 150 MG  24 hr tablet ?Commonly known as: WELLBUTRIN XL ?Take 150 mg daily by mouth. ?  ?calcipotriene-betamethasone external suspension ?Commonly known as: TACLONEX SCALP ?Apply 1 application as needed topically (for flare ups). ?  ?clopidogrel 75 MG tablet ?Commonly known as: PLAVIX ?Take 75 mg daily by mouth. ?  ?losartan 25 MG tablet ?Commonly known as: COZAAR ?Take 25 mg daily by mouth. ?  ?meclizine 25 MG tablet ?Commonly known as: ANTIVERT ?Take 1 tablet (25 mg total) by mouth 3 (three) times daily as needed for dizziness. ?  ?metoprolol  tartrate 25 MG tablet ?Commonly known as: LOPRESSOR ?Take 25 mg daily by mouth. ?  ?nitroGLYCERIN 0.4 MG SL tablet ?Commonly known as: NITROSTAT ?Place 0.4 mg every 5 (five) minutes as needed under the tongue for chest pain. ?  ?rosuvastatin 5 MG tablet ?Commonly known as: CRESTOR ?Take 5 mg 2 (two) times a week by mouth. ?  ?sertraline 100 MG tablet ?Commonly known as: ZOLOFT ?Take 100 mg by mouth daily. ?  ?sildenafil 20 MG tablet ?Commonly known as: Revatio ?Take 1-3 tablets (20-60 mg total) by mouth as needed (before sexual activity for ED). ?  ?tamsulosin 0.4 MG Caps capsule ?Commonly known as: FLOMAX ?Take 1 capsule (0.4 mg total) by mouth daily. ?  ? ?  ? ? ?Allergies:  ?Allergies  ?Allergen Reactions  ? Atorvastatin Other (See Comments)  ?  Muscle Pain  ? Procaine Other (See Comments)  ?  Unknown  ? ? ?Family History: ?Family History  ?Problem Relation Age of Onset  ? Hypertension Brother   ? CAD Father   ? Prostate cancer Neg Hx   ? ? ?Social History:  reports that he quit smoking about 6 years ago. His smoking use included cigarettes. He has never used smokeless tobacco. He reports current alcohol use of about 4.0 standard drinks per week. No history on file for drug use. ? ? ?Physical Exam: ?BP (!) 169/98   Pulse 64   Ht 5\' 10"  (1.778 m)   Wt 198 lb (89.8 kg)   BMI 28.41 kg/m?   ?Constitutional:  Alert and oriented, No acute distress. ?HEENT: [place] AT, moist mucus membranes.  Trachea midline, no masses. ?Cardiovascular: No clubbing, cyanosis, or edema. ?Respiratory: Normal respiratory effort, no increased work of breathing. ?GU: Prostate 35 g, smooth without nodules ?Skin: No rashes, bruises or suspicious lesions. ?Neurologic: Grossly intact, no focal deficits, moving all 4 extremities. ?Psychiatric: Normal mood and affect. ? ? ?Pertinent Imaging: ?CT abdomen/pelvis with contrast ordered by gastroenterology and performed 04/03/2021 was personally reviewed and interpreted.  No GU abnormalities identified.   Prostate volume calculated 35 cc ? ? ?Assessment & Plan:   ?Recent episode mild dysuria which has resolved ?Urine culture was negative and urinalysis unremarkable ?Last PSA 07/2019.  He states he has an appointment scheduled to have blood drawn and annual visit and we will request his PSA be checked at that time ?Offered annual follow-up which he desired.  Return as needed for any recurrent symptoms ? ? ?Abbie Sons, MD ? ?[facility] ?[address], [address] ?[place], [place] [address] ?[phone] ? ?

## 2021-04-14 ENCOUNTER — Encounter: Payer: Self-pay | Admitting: Urology

## 2021-04-17 DIAGNOSIS — Z125 Encounter for screening for malignant neoplasm of prostate: Secondary | ICD-10-CM | POA: Diagnosis not present

## 2021-04-17 DIAGNOSIS — K219 Gastro-esophageal reflux disease without esophagitis: Secondary | ICD-10-CM | POA: Diagnosis not present

## 2021-04-26 DIAGNOSIS — R0602 Shortness of breath: Secondary | ICD-10-CM | POA: Diagnosis not present

## 2021-05-03 DIAGNOSIS — F419 Anxiety disorder, unspecified: Secondary | ICD-10-CM | POA: Diagnosis not present

## 2021-05-03 DIAGNOSIS — R7302 Impaired glucose tolerance (oral): Secondary | ICD-10-CM | POA: Diagnosis not present

## 2021-05-03 DIAGNOSIS — F339 Major depressive disorder, recurrent, unspecified: Secondary | ICD-10-CM | POA: Diagnosis not present

## 2021-05-03 DIAGNOSIS — Z23 Encounter for immunization: Secondary | ICD-10-CM | POA: Diagnosis not present

## 2021-05-03 DIAGNOSIS — K219 Gastro-esophageal reflux disease without esophagitis: Secondary | ICD-10-CM | POA: Diagnosis not present

## 2021-05-03 DIAGNOSIS — G4733 Obstructive sleep apnea (adult) (pediatric): Secondary | ICD-10-CM | POA: Diagnosis not present

## 2021-05-03 DIAGNOSIS — E782 Mixed hyperlipidemia: Secondary | ICD-10-CM | POA: Diagnosis not present

## 2021-05-03 DIAGNOSIS — I2510 Atherosclerotic heart disease of native coronary artery without angina pectoris: Secondary | ICD-10-CM | POA: Diagnosis not present

## 2021-05-03 DIAGNOSIS — Z0000 Encounter for general adult medical examination without abnormal findings: Secondary | ICD-10-CM | POA: Diagnosis not present

## 2021-05-04 DIAGNOSIS — R7302 Impaired glucose tolerance (oral): Secondary | ICD-10-CM | POA: Diagnosis not present

## 2021-05-04 DIAGNOSIS — E782 Mixed hyperlipidemia: Secondary | ICD-10-CM | POA: Diagnosis not present

## 2021-06-06 ENCOUNTER — Ambulatory Visit
Admission: RE | Admit: 2021-06-06 | Discharge: 2021-06-06 | Disposition: A | Discharge status: Home / Self Care | Payer: Medicare HMO | Attending: Gastroenterology | Admitting: Gastroenterology

## 2021-06-06 ENCOUNTER — Ambulatory Visit: Payer: Medicare HMO | Admitting: Anesthesiology

## 2021-06-06 ENCOUNTER — Encounter: Payer: Self-pay | Admitting: *Deleted

## 2021-06-06 ENCOUNTER — Encounter
Admission: RE | Disposition: A | Discharge status: Home / Self Care | Payer: Self-pay | Source: Home / Self Care | Attending: Gastroenterology

## 2021-06-06 DIAGNOSIS — E7849 Other hyperlipidemia: Secondary | ICD-10-CM | POA: Diagnosis not present

## 2021-06-06 DIAGNOSIS — K449 Diaphragmatic hernia without obstruction or gangrene: Secondary | ICD-10-CM | POA: Insufficient documentation

## 2021-06-06 DIAGNOSIS — K219 Gastro-esophageal reflux disease without esophagitis: Secondary | ICD-10-CM | POA: Insufficient documentation

## 2021-06-06 DIAGNOSIS — I2510 Atherosclerotic heart disease of native coronary artery without angina pectoris: Secondary | ICD-10-CM | POA: Insufficient documentation

## 2021-06-06 DIAGNOSIS — D126 Benign neoplasm of colon, unspecified: Secondary | ICD-10-CM | POA: Diagnosis not present

## 2021-06-06 DIAGNOSIS — Z955 Presence of coronary angioplasty implant and graft: Secondary | ICD-10-CM | POA: Insufficient documentation

## 2021-06-06 DIAGNOSIS — R1013 Epigastric pain: Secondary | ICD-10-CM | POA: Diagnosis not present

## 2021-06-06 DIAGNOSIS — K5730 Diverticulosis of large intestine without perforation or abscess without bleeding: Secondary | ICD-10-CM | POA: Insufficient documentation

## 2021-06-06 DIAGNOSIS — I252 Old myocardial infarction: Secondary | ICD-10-CM | POA: Diagnosis not present

## 2021-06-06 DIAGNOSIS — G4733 Obstructive sleep apnea (adult) (pediatric): Secondary | ICD-10-CM | POA: Diagnosis not present

## 2021-06-06 DIAGNOSIS — D123 Benign neoplasm of transverse colon: Secondary | ICD-10-CM | POA: Insufficient documentation

## 2021-06-06 DIAGNOSIS — D122 Benign neoplasm of ascending colon: Secondary | ICD-10-CM | POA: Diagnosis not present

## 2021-06-06 DIAGNOSIS — Z7902 Long term (current) use of antithrombotics/antiplatelets: Secondary | ICD-10-CM | POA: Insufficient documentation

## 2021-06-06 DIAGNOSIS — Z1211 Encounter for screening for malignant neoplasm of colon: Secondary | ICD-10-CM | POA: Diagnosis not present

## 2021-06-06 HISTORY — DX: Gastro-esophageal reflux disease without esophagitis: K21.9

## 2021-06-06 HISTORY — PX: ESOPHAGOGASTRODUODENOSCOPY (EGD) WITH PROPOFOL: SHX5813

## 2021-06-06 HISTORY — PX: COLONOSCOPY WITH PROPOFOL: SHX5780

## 2021-06-06 SURGERY — COLONOSCOPY WITH PROPOFOL
Anesthesia: General

## 2021-06-06 MED ORDER — PROPOFOL 500 MG/50ML IV EMUL
INTRAVENOUS | Status: DC | PRN
  Administered 2021-06-06: 200 ug/kg/min via INTRAVENOUS

## 2021-06-06 MED ORDER — PROPOFOL 10 MG/ML IV BOLUS
INTRAVENOUS | Status: DC | PRN
  Administered 2021-06-06: 70 mg via INTRAVENOUS

## 2021-06-06 MED ORDER — SODIUM CHLORIDE 0.9 % IV SOLN: INTRAVENOUS | Status: DC

## 2021-06-06 NOTE — Transfer of Care (Signed)
Immediate Anesthesia Transfer of Care Note ? ?Patient: William Conway ? ?Procedure(s) Performed: COLONOSCOPY WITH PROPOFOL ?ESOPHAGOGASTRODUODENOSCOPY (EGD) WITH PROPOFOL ? ?Patient Location: PACU ? ?Anesthesia Type:General ? ?Level of Consciousness: sedated ? ?Airway & Oxygen Therapy: Patient Spontanous Breathing and Patient connected to face mask oxygen ? ?Post-op Assessment: Report given to RN and Post -op Vital signs reviewed and stable ? ?Post vital signs: Reviewed and stable ? ?Last Vitals:  ?Vitals Value Taken Time  ?BP    ?Temp    ?Pulse 61 06/06/21 0831  ?Resp 16 06/06/21 0831  ?SpO2 96 % 06/06/21 0831  ?Vitals shown include unvalidated device data. ? ?Last Pain:  ?Vitals:  ? 06/06/21 0704  ?TempSrc: Temporal  ?PainSc: 0-No pain  ?   ? ?  ? ?Complications: No notable events documented. ?

## 2021-06-06 NOTE — Anesthesia Procedure Notes (Signed)
Date/Time: 06/06/2021 8:08 AM ?Performed by: Nelda Marseille, CRNA ?Pre-anesthesia Checklist: Patient identified, Emergency Drugs available, Suction available, Patient being monitored and Timeout performed ?Oxygen Delivery Method: Simple face mask ? ? ? ? ?

## 2021-06-06 NOTE — H&P (Signed)
Outpatient short stay form Pre-procedure ?06/06/2021  ?Lesly Rubenstein, MD ? ?Primary Physician: [name], MD ? ?Reason for visit:  Dyspepsia/Screening ? ?History of present illness:   ? ?66 y/o gentleman with history of depression and CAD here for EGD/Colonoscopy for dyspeptic symptoms and colon cancer screening. Last colonoscopy was over 10 years ago and was normal. He takes plavix with last dose being 3 days ago. Explained to him that we can take small polyps out on plavix but if anything large is found then may need to repeat procedure once plavix is off. No family history of GI malignancies. No significant abdominal surgeries. ? ? ? ?Current Facility-Administered Medications:  ?  0.9 %  sodium chloride infusion, , Intravenous, Continuous, Clemente Dewey, Hilton Cork, MD, Last Rate: 20 mL/hr at 06/06/21 0719, New Bag at 06/06/21 0719 ? ?Medications Prior to Admission  ?Medication Sig Dispense Refill Last Dose  ? buPROPion (WELLBUTRIN XL) 150 MG 24 hr tablet Take 150 mg daily by mouth.   06/05/2021  ? losartan (COZAAR) 25 MG tablet Take 25 mg daily by mouth.    06/05/2021  ? metoprolol tartrate (LOPRESSOR) 25 MG tablet Take 25 mg daily by mouth.   06/05/2021  ? pantoprazole (PROTONIX) 20 MG tablet Take 20 mg by mouth daily.     ? sertraline (ZOLOFT) 100 MG tablet Take 100 mg by mouth daily.  0 06/05/2021  ? calcipotriene-betamethasone (TACLONEX SCALP) external suspension Apply 1 application as needed topically (for flare ups).      ? clopidogrel (PLAVIX) 75 MG tablet Take 75 mg daily by mouth.   06/03/2021  ? meclizine (ANTIVERT) 25 MG tablet Take 1 tablet (25 mg total) by mouth 3 (three) times daily as needed for dizziness. 30 tablet 0   ? nitroGLYCERIN (NITROSTAT) 0.4 MG SL tablet Place 0.4 mg every 5 (five) minutes as needed under the tongue for chest pain.      ? rosuvastatin (CRESTOR) 5 MG tablet Take 5 mg 2 (two) times a week by mouth.      ? sildenafil (REVATIO) 20 MG tablet Take 1-3 tablets (20-60 mg  total) by mouth as needed (before sexual activity for ED). 30 tablet 6   ? tamsulosin (FLOMAX) 0.4 MG CAPS capsule Take 1 capsule (0.4 mg total) by mouth daily. 30 capsule 0   ? ? ? ?Allergies  ?Allergen Reactions  ? Atorvastatin Other (See Comments)  ?  Muscle Pain  ? Procaine Other (See Comments)  ?  Unknown  ? ? ? ?Past Medical History:  ?Diagnosis Date  ? Allergic rhinitis 11/17/2014  ? Coronary artery disease   ? Familial multiple lipoprotein-type hyperlipidemia 06/21/2014  ? GERD (gastroesophageal reflux disease)   ? Heart attack ([place]) 07/06/2014  ? Overview:  A. 2009: Perfusion Scan Normal B. STEMI: Cardiac Cath: EF 65%. Acute occlusion RCA, nonobstructive LAD, LCX 5/16 with [place] [phone] DES   ? History of multiple pulmonary nodules 11/17/2014  ? Stable nodules 3 nodules 02/26/13   ? Mood disorder of depressed type 11/17/2014  ? Myocardial infarction ([place]) 06/20/2014  ? Overview:  A. 2009: Perfusion Scan Normal B. STEMI: Cardiac Cath: EF 65%. Acute occlusion RCA, nonobstructive LAD, LCX 5/16 with [place] [phone] DES   ? Obstructive apnea 11/17/2014  ? Overview:  Overview:  Intolerant of CPAP   ? OSA (obstructive sleep apnea) 11/17/2014  ? Intolerant of CPAP   ? Seasonal and perennial allergic rhinitis 11/17/2014  ? ST elevation myocardial infarction (STEMI) (  [place]) 07/06/2014  ? Overview:  Overview:  Overview:  A. 2009: Perfusion Scan Normal B. STEMI: Cardiac Cath: EF 65%. Acute occlusion RCA, nonobstructive LAD, LCX 5/16 with [place] [phone] DES   ? ? ?Review of systems:  Otherwise negative.  ? ? ?Physical Exam ? ?Gen: Alert, oriented. Appears stated age.  ?HEENT: PERRLA. ?Lungs: No respiratory distress ?CV: RRR ?Abd: soft, benign, no masses ?Ext: No edema ? ? ? ?Planned procedures: Proceed with EGD/colonoscopy. The patient understands the nature of the planned procedure, indications, risks, alternatives and potential complications including but not limited to  bleeding, infection, perforation, damage to internal organs and possible oversedation/side effects from anesthesia. The patient agrees and gives consent to proceed.  ?Please refer to procedure notes for findings, recommendations and patient disposition/instructions.  ? ? ? ?Lesly Rubenstein, MD ?[facility] ? ? ? ?  ? ?

## 2021-06-06 NOTE — Op Note (Signed)
[facility] ?Gastroenterology ?Patient Name: William Conway ?Procedure Date: 06/06/2021 7:23 AM ?MRN: [id number] ?Account #: [id number] ?Date of Birth: 1956/01/11 ?Admit Type: Outpatient ?Age: 66 ?Room: [facility] ENDO ROOM 1 ?Gender: Male ?Note Status: Finalized ?Instrument Name: Colonscope [id number] ?Procedure:             Colonoscopy ?Indications:           Screening for colorectal malignant neoplasm ?Providers:             Andrey Farmer MD, MD ?Medicines:             Monitored Anesthesia Care ?Complications:         No immediate complications. Estimated blood loss:  ?                       Minimal. ?Procedure:             Pre-Anesthesia Assessment: ?                       - Prior to the procedure, a History and Physical was  ?                       performed, and patient medications and allergies were  ?                       reviewed. The patient is competent. The risks and  ?                       benefits of the procedure and the sedation options and  ?                       risks were discussed with the patient. All questions  ?                       were answered and informed consent was obtained.  ?                       Patient identification and proposed procedure were  ?                       verified by the physician, the nurse, the  ?                       anesthesiologist, the anesthetist and the technician  ?                       in the endoscopy suite. Mental Status Examination:  ?                       alert and oriented. Airway Examination: normal  ?                       oropharyngeal airway and neck mobility. Respiratory  ?                       Examination: clear to auscultation. CV Examination:  ?                       normal. Prophylactic Antibiotics: The patient does not  ?  require prophylactic antibiotics. Prior  ?                       Anticoagulants: The patient has taken Plavix  ?                       (clopidogrel), last dose was 3 days prior to  ?                        procedure. ASA Grade Assessment: II - A patient with  ?                       mild systemic disease. After reviewing the risks and  ?                       benefits, the patient was deemed in satisfactory  ?                       condition to undergo the procedure. The anesthesia  ?                       plan was to use monitored anesthesia care (MAC).  ?                       Immediately prior to administration of medications,  ?                       the patient was re-assessed for adequacy to receive  ?                       sedatives. The heart rate, respiratory rate, oxygen  ?                       saturations, blood pressure, adequacy of pulmonary  ?                       ventilation, and response to care were monitored  ?                       throughout the procedure. The physical status of the  ?                       patient was re-assessed after the procedure. ?                       After obtaining informed consent, the colonoscope was  ?                       passed under direct vision. Throughout the procedure,  ?                       the patient's blood pressure, pulse, and oxygen  ?                       saturations were monitored continuously. The  ?                       Colonoscope was introduced through the anus and  ?  advanced to the the cecum, identified by appendiceal  ?                       orifice and ileocecal valve. The colonoscopy was  ?                       performed without difficulty. The patient tolerated  ?                       the procedure well. The quality of the bowel  ?                       preparation was good. ?Findings: ?     The perianal and digital rectal examinations were normal. ?     Many small and large-mouthed diverticula were found in the sigmoid  ?     colon, descending colon, splenic flexure, transverse colon, hepatic  ?     flexure, ascending colon and cecum. ?     A 3 mm polyp was found in the distal transverse colon.  The polyp was  ?     sessile. The polyp was removed with a cold snare. Resection and  ?     retrieval were complete. Estimated blood loss was minimal. ?     The exam was otherwise without abnormality on direct and retroflexion  ?     views. ?Impression:            - Diverticulosis in the sigmoid colon, in the  ?                       descending colon, at the splenic flexure, in the  ?                       transverse colon, at the hepatic flexure, in the  ?                       ascending colon and in the cecum. ?                       - One 3 mm polyp in the distal transverse colon,  ?                       removed with a cold snare. Resected and retrieved. ?                       - The examination was otherwise normal on direct and  ?                       retroflexion views. ?Recommendation:        - Discharge patient to home. ?                       - Resume previous diet. ?                       - Continue present medications. ?                       - Await pathology results. ?                       -  Repeat colonoscopy for surveillance based on  ?                       pathology results. ?                       - Resume Plavix (clopidogrel) at prior dose today. ?                       - Return to referring physician as previously  ?                       scheduled. ?Procedure Code(s):     --- Professional --- ?                       [id number], Colonoscopy, flexible; with removal of  ?                       tumor(s), polyp(s), or other lesion(s) by snare  ?                       technique ?Diagnosis Code(s):     --- Professional --- ?                       Z12.11, Encounter for screening for malignant neoplasm  ?                       of colon ?                       K63.5, Polyp of colon ?                       K57.30, Diverticulosis of large intestine without  ?                       perforation or abscess without bleeding ?CPT copyright 2019 [employer]. All rights reserved. ?The codes  documented in this report are preliminary and upon coder review may  ?be revised to meet current compliance requirements. ?Andrey Farmer MD, MD ?06/06/2021 8:34:11 AM ?Number of Addenda: 0 ?Note Initiated On: 06/06/2021 7:23 AM ?Scope Withdrawal Time: 0 hours 10 minutes 40 seconds  ?Total Procedure Duration: 0 hours 13 minutes 3 seconds  ?Estimated Blood Loss:  Estimated blood loss was minimal. ?     [facility] ?

## 2021-06-06 NOTE — Anesthesia Postprocedure Evaluation (Signed)
Anesthesia Post Note ? ?Patient: KEESHAWN FAKHOURI ? ?Procedure(s) Performed: COLONOSCOPY WITH PROPOFOL ?ESOPHAGOGASTRODUODENOSCOPY (EGD) WITH PROPOFOL ? ?Patient location during evaluation: Endoscopy ?Anesthesia Type: General ?Level of consciousness: awake and alert ?Pain management: pain level controlled ?Vital Signs Assessment: post-procedure vital signs reviewed and stable ?Respiratory status: spontaneous breathing, nonlabored ventilation, respiratory function stable and patient connected to nasal cannula oxygen ?Cardiovascular status: blood pressure returned to baseline and stable ?Postop Assessment: no apparent nausea or vomiting ?Anesthetic complications: no ? ? ?No notable events documented. ? ? ?Last Vitals:  ?Vitals:  ? 06/06/21 0850 06/06/21 0900  ?BP: 131/77 130/80  ?Pulse: (!) 56 (!) 54  ?Resp: 15 11  ?Temp:    ?SpO2: 97% 98%  ?  ?Last Pain:  ?Vitals:  ? 06/06/21 0704  ?TempSrc: Temporal  ?PainSc: 0-No pain  ? ? ?  ?  ?  ?  ?  ?  ? ?Martha Clan ? ? ? ? ?

## 2021-06-06 NOTE — Anesthesia Preprocedure Evaluation (Signed)
Anesthesia Evaluation  ?Patient identified by MRN, date of birth, ID band ?Patient awake ? ? ? ?Reviewed: ?Allergy & Precautions, H&P , NPO status , Patient's Chart, lab work & pertinent test results, reviewed documented beta blocker date and time  ? ?History of Anesthesia Complications ?Negative for: history of anesthetic complications ? ?Airway ?Mallampati: I ? ?TM Distance: >3 FB ?Neck ROM: full ? ? ? Dental ? ?(+) Dental Advidsory Given, Caps, Teeth Intact, Missing ?Bridge on top front:   ?Pulmonary ?neg shortness of breath, sleep apnea , neg COPD, neg recent URI, former smoker,  ?  ?Pulmonary exam normal ?breath sounds clear to auscultation ? ? ? ? ? ? Cardiovascular ?Exercise Tolerance: Good ?(-) hypertension(-) angina+ CAD, + Past MI and + Cardiac Stents  ?Normal cardiovascular exam(-) dysrhythmias (-) Valvular Problems/Murmurs ?Rhythm:regular Rate:Normal ? ? ?  ?Neuro/Psych ?PSYCHIATRIC DISORDERS Depression negative neurological ROS ?   ? GI/Hepatic ?Neg liver ROS, GERD  ,  ?Endo/Other  ?negative endocrine ROS ? Renal/GU ?negative Renal ROS  ?negative genitourinary ?  ?Musculoskeletal ? ? Abdominal ?  ?Peds ? Hematology ?negative hematology ROS ?(+)   ?Anesthesia Other Findings ?Past Medical History: ?11/17/2014: Allergic rhinitis ?No date: Coronary artery disease ?06/21/2014: Familial multiple lipoprotein-type hyperlipidemia ?No date: GERD (gastroesophageal reflux disease) ?07/06/2014: Heart attack ([place]) ?    Comment:  Overview:  A. 2009: Perfusion Scan Normal B. STEMI:  ?             Cardiac Cath: EF 65%. Acute occlusion RCA, nonobstructive ?             LAD, LCX 5/16 with [employer] Promus [phone] DES  ?11/17/2014: History of multiple pulmonary nodules ?    Comment:  Stable nodules 3 nodules 02/26/13  ?11/17/2014: Mood disorder of depressed type ?06/20/2014: Myocardial infarction [facility]) ?    Comment:  Overview:  A. 2009: Perfusion Scan Normal B. STEMI:  ?              Cardiac Cath: EF 65%. Acute occlusion RCA, nonobstructive ?             LAD, LCX 5/16 with [employer] Promus [id number] DES  ?11/17/2014: Obstructive apnea ?    Comment:  Overview:  Overview:  Intolerant of CPAP  ?11/17/2014: OSA (obstructive sleep apnea) ?    Comment:  Intolerant of CPAP  ?11/17/2014: Seasonal and perennial allergic rhinitis ?07/06/2014: ST elevation myocardial infarction (STEMI) ([place]) ?    Comment:  Overview:  Overview:  Overview:  A. 2009: Perfusion Scan ?             Normal B. STEMI: Cardiac Cath: EF 65%. Acute occlusion  ?             RCA, nonobstructive LAD, LCX 5/16 with [employer]  ?             Promus [id number] DES  ? ? Reproductive/Obstetrics ?negative OB ROS ? ?  ? ? ? ? ? ? ? ? ? ? ? ? ? ?  ?  ? ? ? ? ? ? ? ? ?Anesthesia Physical ?Anesthesia Plan ? ?ASA: 3 ? ?Anesthesia Plan: General  ? ?Post-op Pain Management:   ? ?Induction: Intravenous ? ?PONV Risk Score and Plan: 2 and Propofol infusion and TIVA ? ?Airway Management Planned: Natural Airway and Nasal Cannula ? ?Additional Equipment:  ? ?Intra-op Plan:  ? ?Post-operative Plan:  ? ?Informed Consent: I have reviewed the patients History and Physical, chart, labs and discussed the procedure  including the risks, benefits and alternatives for the proposed anesthesia with the patient or authorized representative who has indicated his/her understanding and acceptance.  ? ? ? ?Dental Advisory Given ? ?Plan Discussed with: Anesthesiologist, CRNA and Surgeon ? ?Anesthesia Plan Comments:   ? ? ? ? ? ? ?Anesthesia Quick Evaluation ? ?

## 2021-06-06 NOTE — Op Note (Signed)
[facility] ?Gastroenterology ?Patient Name: William Conway ?Procedure Date: 06/06/2021 7:23 AM ?MRN: [id number] ?Account #: [id number] ?Date of Birth: 17-Feb-1955 ?Admit Type: Outpatient ?Age: 66 ?Room: [facility] ENDO ROOM 1 ?Gender: Male ?Note Status: Finalized ?Instrument Name: Upper Endoscope [id number] ?Procedure:             Upper GI endoscopy ?Indications:           Epigastric abdominal pain, Gastro-esophageal reflux  ?                       disease ?Providers:             Andrey Farmer MD, MD ?Medicines:             Monitored Anesthesia Care ?Complications:         No immediate complications. ?Procedure:             Pre-Anesthesia Assessment: ?                       - Prior to the procedure, a History and Physical was  ?                       performed, and patient medications and allergies were  ?                       reviewed. The patient is competent. The risks and  ?                       benefits of the procedure and the sedation options and  ?                       risks were discussed with the patient. All questions  ?                       were answered and informed consent was obtained.  ?                       Patient identification and proposed procedure were  ?                       verified by the physician, the nurse, the  ?                       anesthesiologist, the anesthetist and the technician  ?                       in the endoscopy suite. Mental Status Examination:  ?                       alert and oriented. Airway Examination: normal  ?                       oropharyngeal airway and neck mobility. Respiratory  ?                       Examination: clear to auscultation. CV Examination:  ?                       normal. Prophylactic Antibiotics: The patient does not  ?  require prophylactic antibiotics. Prior  ?                       Anticoagulants: The patient has taken Plavix  ?                       (clopidogrel), last dose was 3 days prior to  ?                        procedure. ASA Grade Assessment: II - A patient with  ?                       mild systemic disease. After reviewing the risks and  ?                       benefits, the patient was deemed in satisfactory  ?                       condition to undergo the procedure. The anesthesia  ?                       plan was to use monitored anesthesia care (MAC).  ?                       Immediately prior to administration of medications,  ?                       the patient was re-assessed for adequacy to receive  ?                       sedatives. The heart rate, respiratory rate, oxygen  ?                       saturations, blood pressure, adequacy of pulmonary  ?                       ventilation, and response to care were monitored  ?                       throughout the procedure. The physical status of the  ?                       patient was re-assessed after the procedure. ?                       After obtaining informed consent, the endoscope was  ?                       passed under direct vision. Throughout the procedure,  ?                       the patient's blood pressure, pulse, and oxygen  ?                       saturations were monitored continuously. The Endoscope  ?                       was introduced through the mouth, and advanced to the  ?  second part of duodenum. The upper GI endoscopy was  ?                       accomplished without difficulty. The patient tolerated  ?                       the procedure well. ?Findings: ?     A small hiatal hernia was present. ?     The examined esophagus was normal. ?     The entire examined stomach was normal. ?     The examined duodenum was normal. ?Impression:            - Small hiatal hernia. ?                       - Normal esophagus. ?                       - Normal stomach. ?                       - Normal examined duodenum. ?                       - No specimens collected. ?Recommendation:        - Perform a  colonoscopy today. ?Procedure Code(s):     --- Professional --- ?                       [phone], Esophagogastroduodenoscopy, flexible,  ?                       transoral; diagnostic, including collection of  ?                       specimen(s) by brushing or washing, when performed  ?                       (separate procedure) ?Diagnosis Code(s):     --- Professional --- ?                       K44.9, Diaphragmatic hernia without obstruction or  ?                       gangrene ?                       R10.13, Epigastric pain ?                       K21.9, Gastro-esophageal reflux disease without  ?                       esophagitis ?CPT copyright 2019 American Medical Association. All rights reserved. ?The codes documented in this report are preliminary and upon coder review may  ?be revised to meet current compliance requirements. ?Andrey Farmer MD, MD ?06/06/2021 8:31:02 AM ?Number of Addenda: 0 ?Note Initiated On: 06/06/2021 7:23 AM ?Estimated Blood Loss:  Estimated blood loss: none. ?     [facility] ?

## 2021-06-06 NOTE — Interval H&P Note (Signed)
History and Physical Interval Note: ? ?06/06/2021 ?8:03 AM ? ?William Conway  has presented today for surgery, with the diagnosis of CCA SCREEN/GERD.  The various methods of treatment have been discussed with the patient and family. After consideration of risks, benefits and other options for treatment, the patient has consented to  Procedure(s): ?COLONOSCOPY WITH PROPOFOL (N/A) ?ESOPHAGOGASTRODUODENOSCOPY (EGD) WITH PROPOFOL (N/A) as a surgical intervention.  The patient's history has been reviewed, patient examined, no change in status, stable for surgery.  I have reviewed the patient's chart and labs.  Questions were answered to the patient's satisfaction.   ? ? ?Hilton Cork Lavona Norsworthy ? ?Ok to proceed with EGD/Colonoscopy ?

## 2021-06-07 ENCOUNTER — Encounter: Payer: Self-pay | Admitting: Gastroenterology

## 2021-06-07 LAB — SURGICAL PATHOLOGY

## 2021-06-19 DIAGNOSIS — R519 Headache, unspecified: Secondary | ICD-10-CM | POA: Diagnosis not present

## 2021-06-19 DIAGNOSIS — E538 Deficiency of other specified B group vitamins: Secondary | ICD-10-CM | POA: Diagnosis not present

## 2021-06-19 DIAGNOSIS — E559 Vitamin D deficiency, unspecified: Secondary | ICD-10-CM | POA: Diagnosis not present

## 2021-06-28 DIAGNOSIS — K219 Gastro-esophageal reflux disease without esophagitis: Secondary | ICD-10-CM | POA: Diagnosis not present

## 2021-06-28 DIAGNOSIS — R7401 Elevation of levels of liver transaminase levels: Secondary | ICD-10-CM | POA: Diagnosis not present

## 2021-07-21 DIAGNOSIS — R748 Abnormal levels of other serum enzymes: Secondary | ICD-10-CM | POA: Diagnosis not present

## 2021-07-21 DIAGNOSIS — Z1159 Encounter for screening for other viral diseases: Secondary | ICD-10-CM | POA: Diagnosis not present

## 2021-10-11 DIAGNOSIS — G4733 Obstructive sleep apnea (adult) (pediatric): Secondary | ICD-10-CM | POA: Diagnosis not present

## 2021-10-11 DIAGNOSIS — R5382 Chronic fatigue, unspecified: Secondary | ICD-10-CM | POA: Diagnosis not present

## 2021-10-11 DIAGNOSIS — I2119 ST elevation (STEMI) myocardial infarction involving other coronary artery of inferior wall: Secondary | ICD-10-CM | POA: Diagnosis not present

## 2021-10-11 DIAGNOSIS — I2510 Atherosclerotic heart disease of native coronary artery without angina pectoris: Secondary | ICD-10-CM | POA: Diagnosis not present

## 2021-10-11 DIAGNOSIS — E782 Mixed hyperlipidemia: Secondary | ICD-10-CM | POA: Diagnosis not present

## 2021-10-31 DIAGNOSIS — Z87891 Personal history of nicotine dependence: Secondary | ICD-10-CM | POA: Diagnosis not present

## 2021-11-01 DIAGNOSIS — I2510 Atherosclerotic heart disease of native coronary artery without angina pectoris: Secondary | ICD-10-CM | POA: Diagnosis not present

## 2021-11-01 DIAGNOSIS — Z87891 Personal history of nicotine dependence: Secondary | ICD-10-CM | POA: Diagnosis not present

## 2021-11-01 DIAGNOSIS — Z955 Presence of coronary angioplasty implant and graft: Secondary | ICD-10-CM | POA: Diagnosis not present

## 2021-11-01 DIAGNOSIS — Z122 Encounter for screening for malignant neoplasm of respiratory organs: Secondary | ICD-10-CM | POA: Diagnosis not present

## 2021-11-06 DIAGNOSIS — I2510 Atherosclerotic heart disease of native coronary artery without angina pectoris: Secondary | ICD-10-CM | POA: Diagnosis not present

## 2021-11-06 DIAGNOSIS — E782 Mixed hyperlipidemia: Secondary | ICD-10-CM | POA: Diagnosis not present

## 2021-11-06 DIAGNOSIS — E538 Deficiency of other specified B group vitamins: Secondary | ICD-10-CM | POA: Diagnosis not present

## 2021-11-06 DIAGNOSIS — G4733 Obstructive sleep apnea (adult) (pediatric): Secondary | ICD-10-CM | POA: Diagnosis not present

## 2021-11-06 DIAGNOSIS — F419 Anxiety disorder, unspecified: Secondary | ICD-10-CM | POA: Diagnosis not present

## 2021-11-06 DIAGNOSIS — K219 Gastro-esophageal reflux disease without esophagitis: Secondary | ICD-10-CM | POA: Diagnosis not present

## 2021-11-06 DIAGNOSIS — F339 Major depressive disorder, recurrent, unspecified: Secondary | ICD-10-CM | POA: Diagnosis not present

## 2021-11-06 DIAGNOSIS — R7302 Impaired glucose tolerance (oral): Secondary | ICD-10-CM | POA: Diagnosis not present

## 2021-11-10 ENCOUNTER — Encounter: Payer: Self-pay | Admitting: Urology

## 2021-12-02 DIAGNOSIS — S0181XA Laceration without foreign body of other part of head, initial encounter: Secondary | ICD-10-CM | POA: Diagnosis not present

## 2022-01-01 DIAGNOSIS — K219 Gastro-esophageal reflux disease without esophagitis: Secondary | ICD-10-CM | POA: Diagnosis not present

## 2022-01-01 DIAGNOSIS — R748 Abnormal levels of other serum enzymes: Secondary | ICD-10-CM | POA: Diagnosis not present

## 2022-01-12 DIAGNOSIS — R55 Syncope and collapse: Secondary | ICD-10-CM | POA: Diagnosis not present

## 2022-01-12 DIAGNOSIS — R519 Headache, unspecified: Secondary | ICD-10-CM | POA: Diagnosis not present

## 2022-01-12 DIAGNOSIS — R251 Tremor, unspecified: Secondary | ICD-10-CM | POA: Diagnosis not present

## 2022-01-12 DIAGNOSIS — R202 Paresthesia of skin: Secondary | ICD-10-CM | POA: Diagnosis not present

## 2022-01-22 ENCOUNTER — Other Ambulatory Visit: Payer: Self-pay | Admitting: Student

## 2022-01-22 DIAGNOSIS — R519 Headache, unspecified: Secondary | ICD-10-CM

## 2022-01-30 ENCOUNTER — Ambulatory Visit
Admission: RE | Admit: 2022-01-30 | Discharge: 2022-01-30 | Disposition: A | Discharge status: Home / Self Care | Payer: Medicare HMO | Source: Ambulatory Visit | Attending: Student | Admitting: Student

## 2022-01-30 DIAGNOSIS — R519 Headache, unspecified: Secondary | ICD-10-CM | POA: Insufficient documentation

## 2022-03-03 IMAGING — CT CT ABD-PELV W/ CM
2 of 5 series · 16 of 46 positions shown, 18 images · IV contrast (APPLIED)
Comparison: March 28, 2015

CLINICAL DATA: Mid abdominal pain.

EXAM:
CT ABDOMEN AND PELVIS WITH CONTRAST
TECHNIQUE: Multidetector CT imaging of the abdomen and pelvis was performed
using the standard protocol following bolus administration of
intravenous contrast.

[Series 2: routine abd/pel with · axial · 0.89mm/px · z∈[-952,-522]mm · 13 of 98 slices shown, 15 images]
[im 6/98  soft-tissue]
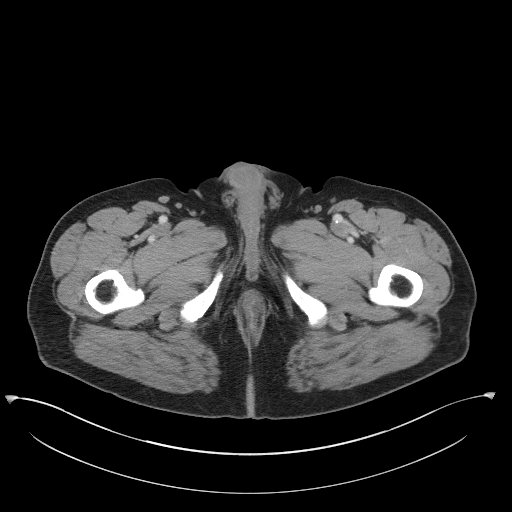
[im 6/98  bone]
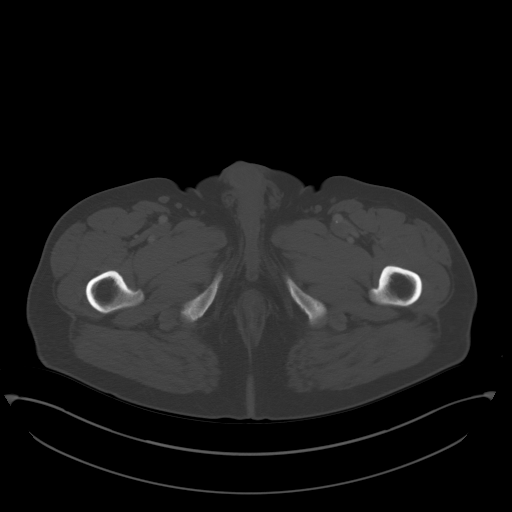
[im 11/98  soft-tissue]
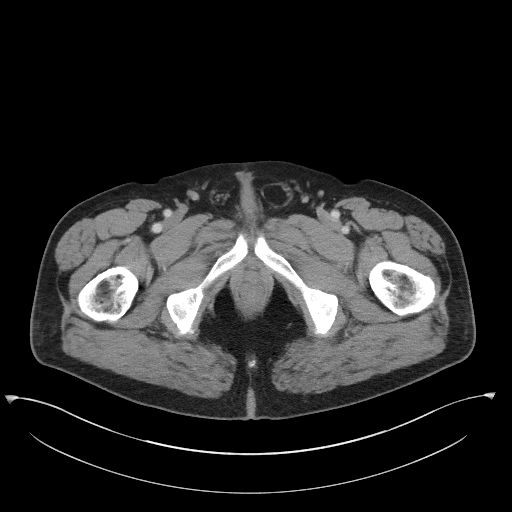
[im 22/98  soft-tissue]
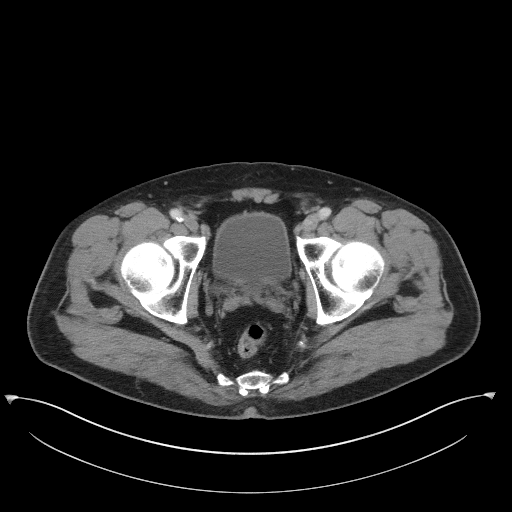
[im 27/98  soft-tissue]
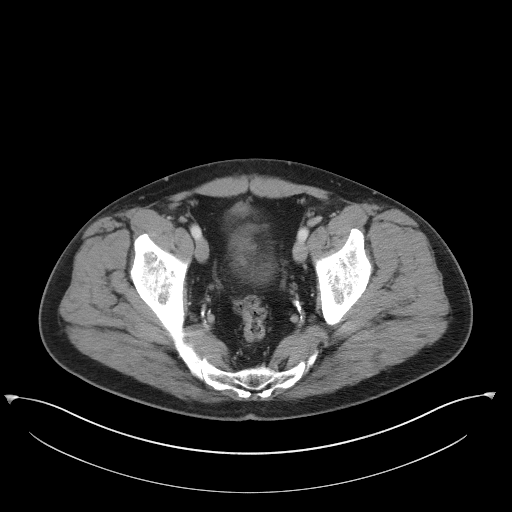
[im 33/98  soft-tissue]
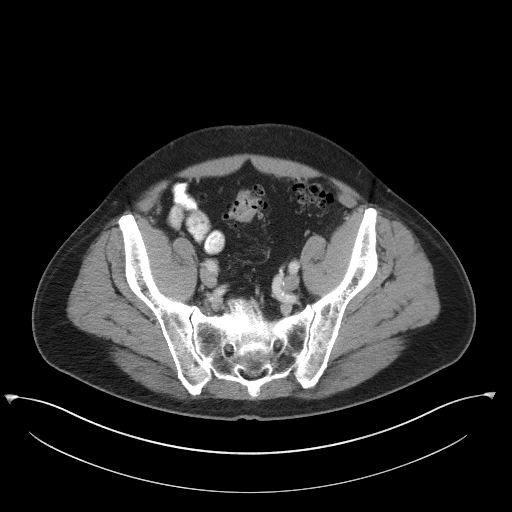
[im 44/98  soft-tissue]
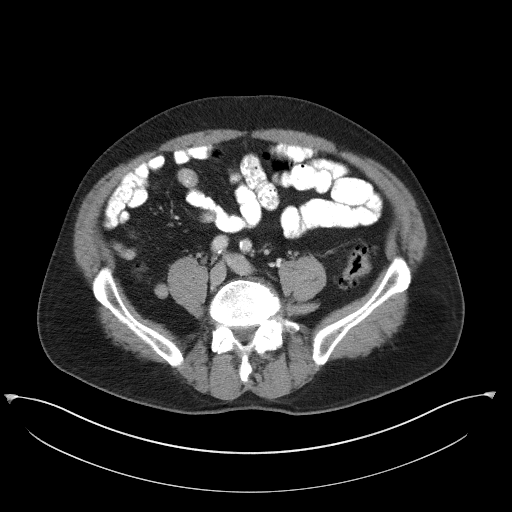
[im 49/98  soft-tissue]
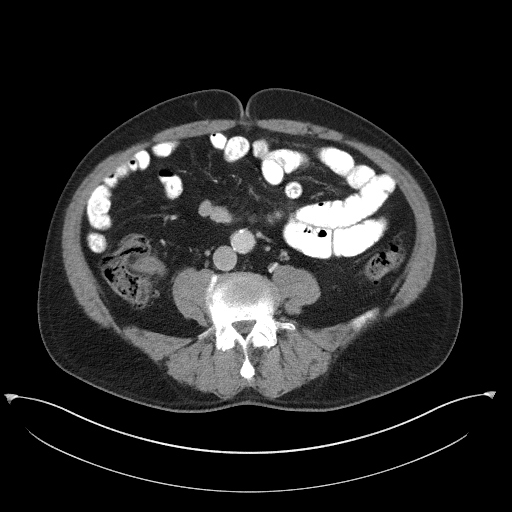
[im 54/98  soft-tissue]
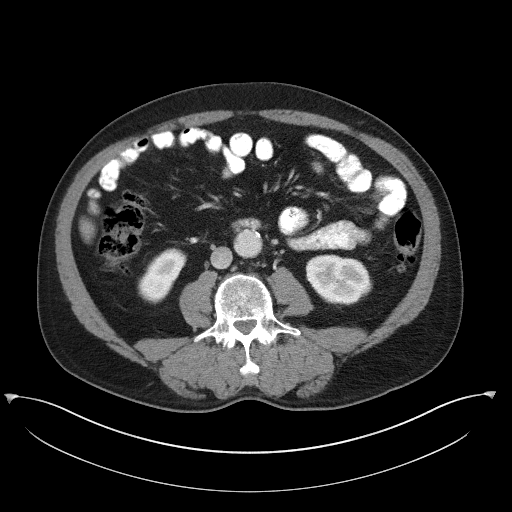
[im 65/98  soft-tissue]
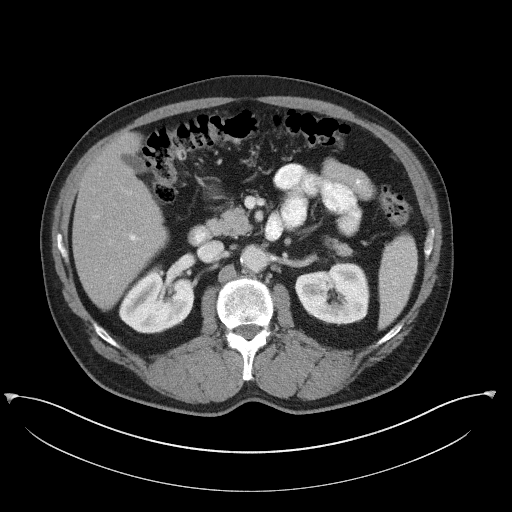
[im 65/98  bone]
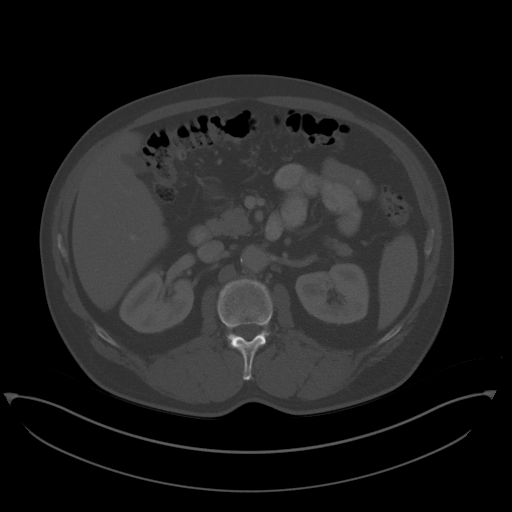
[im 71/98  soft-tissue]
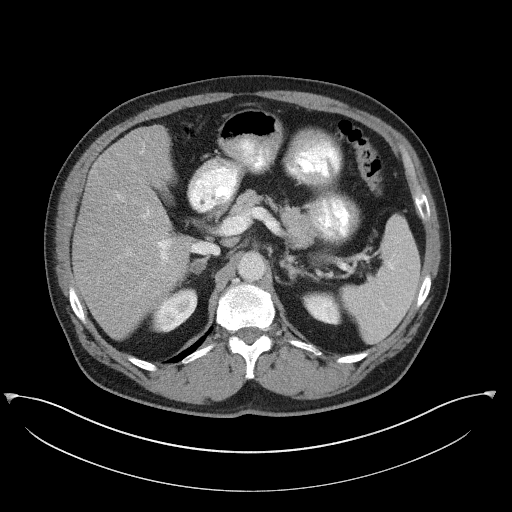
[im 76/98  soft-tissue]
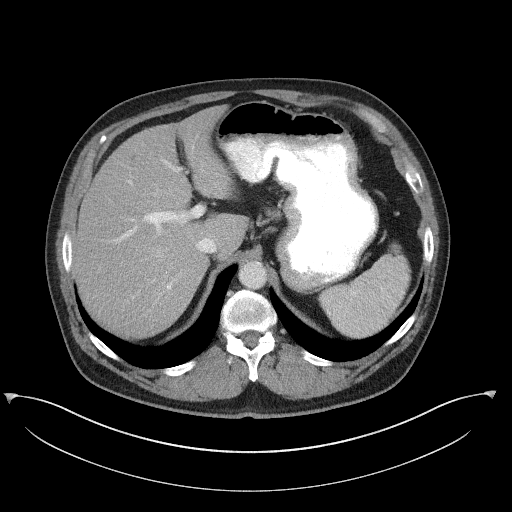
[im 87/98  soft-tissue]
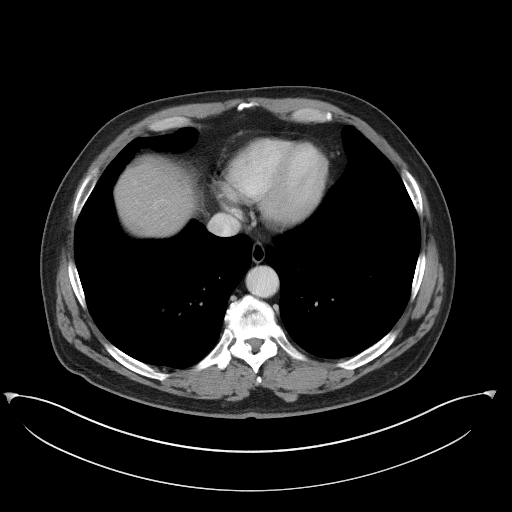
[im 92/98  soft-tissue]
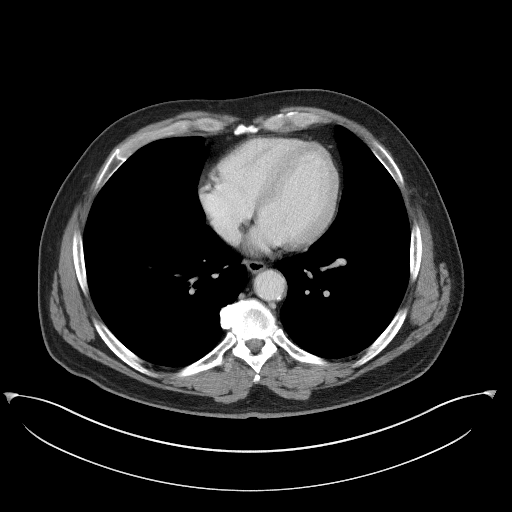

[Series 5: coronal st · coronal · 0.80mm/px · 3 of 91 slices shown]
[im 31/91  soft-tissue]
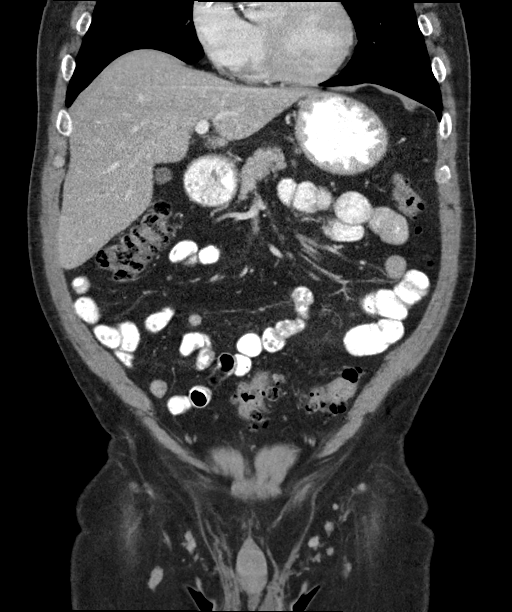
[im 41/91  soft-tissue]
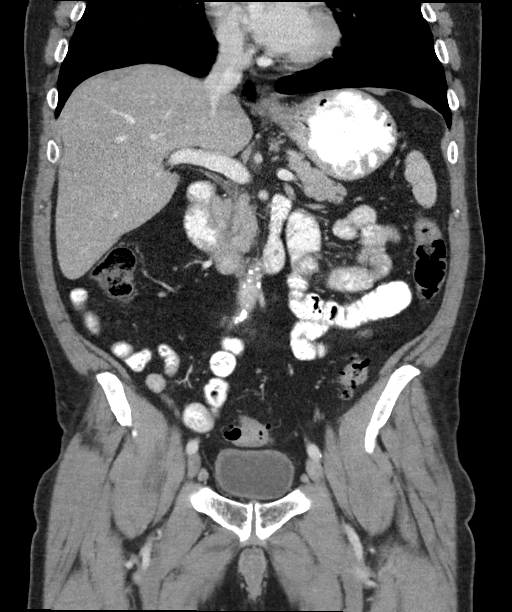
[im 51/91  soft-tissue]
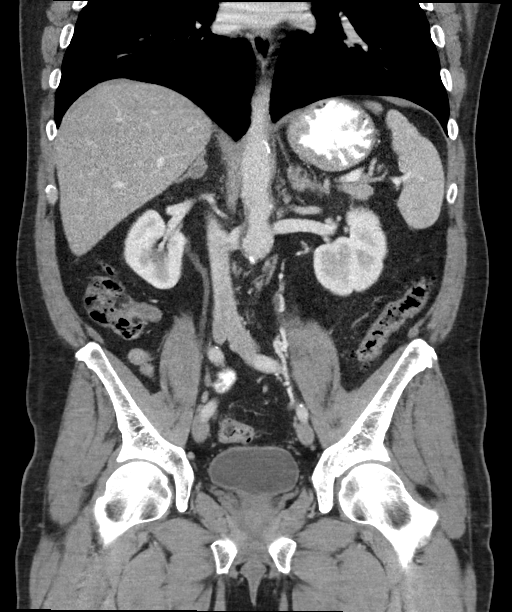

[16 of 46 positions shown; findings below may reference images not displayed]

RADIATION DOSE REDUCTION: This exam was performed according to the
departmental dose-optimization program which includes automated
exposure control, adjustment of the mA and/or kV according to
patient size and/or use of iterative reconstruction technique.

CONTRAST:  100mL OMNIPAQUE IOHEXOL 300 MG/ML  SOLN
FINDINGS: Lower chest: No acute abnormality. Coronary artery
calcifications/stents.

Hepatobiliary: Hepatic steatosis. No suspicious hepatic lesion.
Gallbladder is decompressed. No biliary ductal dilation.

Pancreas: No pancreatic ductal dilation or evidence of acute
inflammation.

Spleen: No splenomegaly or suspicious splenic lesion.

Adrenals/Urinary Tract: Unchanged size of the 12 mm right adrenal
adenoma. Left adrenal glands unremarkable.

No hydronephrosis. Symmetric bilateral renal enhancement and
excretion of contrast material. No solid enhancing renal mass.
Urinary bladder is unremarkable for degree of distension.

Stomach/Bowel: Radiopaque enteric contrast material traverses distal
loops of small bowel. Stomach is moderately distended without wall
thickening. No pathologic dilation of small or large bowel. The
appendix and terminal ileum appear normal. Extensive colonic
diverticulosis without findings of acute diverticulitis.

Vascular/Lymphatic: Aortic and branch vessel atherosclerosis without
abdominal aortic aneurysm. Retroaortic left renal vein. Prominent
retroperitoneal lymph nodes are similar prior and favored reactive.

Reproductive: Prostate is unremarkable.

Other: No significant abdominopelvic free fluid. Small fat
containing left inguinal hernia.

Musculoskeletal: Multilevel degenerative changes spine. No acute
osseous abnormality.
IMPRESSION: 1. No acute abnormality in the abdomen or pelvis.
2. Extensive colonic diverticulosis without findings of acute
diverticulitis.
3. Hepatic steatosis.
4. Stable 12 mm right adrenal adenoma.
5.  Aortic Atherosclerosis ([id number].G).

## 2022-04-02 DIAGNOSIS — R1084 Generalized abdominal pain: Secondary | ICD-10-CM | POA: Diagnosis not present

## 2022-04-02 DIAGNOSIS — R748 Abnormal levels of other serum enzymes: Secondary | ICD-10-CM | POA: Diagnosis not present

## 2022-04-02 DIAGNOSIS — R1902 Left upper quadrant abdominal swelling, mass and lump: Secondary | ICD-10-CM | POA: Diagnosis not present

## 2022-04-02 DIAGNOSIS — R140 Abdominal distension (gaseous): Secondary | ICD-10-CM | POA: Diagnosis not present

## 2022-04-03 ENCOUNTER — Other Ambulatory Visit: Payer: Self-pay | Admitting: Gastroenterology

## 2022-04-03 DIAGNOSIS — R1902 Left upper quadrant abdominal swelling, mass and lump: Secondary | ICD-10-CM

## 2022-04-03 DIAGNOSIS — R1084 Generalized abdominal pain: Secondary | ICD-10-CM

## 2022-04-13 ENCOUNTER — Ambulatory Visit
Admission: RE | Admit: 2022-04-13 | Discharge: 2022-04-13 | Disposition: A | Discharge status: Home / Self Care | Payer: Medicare HMO | Source: Ambulatory Visit | Attending: Gastroenterology | Admitting: Gastroenterology

## 2022-04-13 DIAGNOSIS — K760 Fatty (change of) liver, not elsewhere classified: Secondary | ICD-10-CM | POA: Diagnosis not present

## 2022-04-13 DIAGNOSIS — R1084 Generalized abdominal pain: Secondary | ICD-10-CM

## 2022-04-13 DIAGNOSIS — R1902 Left upper quadrant abdominal swelling, mass and lump: Secondary | ICD-10-CM | POA: Diagnosis not present

## 2022-04-13 DIAGNOSIS — N281 Cyst of kidney, acquired: Secondary | ICD-10-CM | POA: Diagnosis not present

## 2022-04-13 MED ORDER — IOHEXOL 300 MG/ML  SOLN
100.0000 mL | Freq: Once | INTRAMUSCULAR | Status: AC | PRN
  Administered 2022-04-13: 100 mL via INTRAVENOUS

## 2022-04-20 ENCOUNTER — Ambulatory Visit: Payer: Medicare HMO | Admitting: Urology

## 2022-04-23 DIAGNOSIS — R1084 Generalized abdominal pain: Secondary | ICD-10-CM | POA: Diagnosis not present

## 2022-04-27 ENCOUNTER — Ambulatory Visit: Payer: Medicare HMO | Admitting: Urology

## 2022-05-09 DIAGNOSIS — F419 Anxiety disorder, unspecified: Secondary | ICD-10-CM | POA: Diagnosis not present

## 2022-05-09 DIAGNOSIS — I10 Essential (primary) hypertension: Secondary | ICD-10-CM | POA: Diagnosis not present

## 2022-05-09 DIAGNOSIS — I2510 Atherosclerotic heart disease of native coronary artery without angina pectoris: Secondary | ICD-10-CM | POA: Diagnosis not present

## 2022-05-09 DIAGNOSIS — E782 Mixed hyperlipidemia: Secondary | ICD-10-CM | POA: Diagnosis not present

## 2022-05-09 DIAGNOSIS — R7302 Impaired glucose tolerance (oral): Secondary | ICD-10-CM | POA: Diagnosis not present

## 2022-05-09 DIAGNOSIS — Z0000 Encounter for general adult medical examination without abnormal findings: Secondary | ICD-10-CM | POA: Diagnosis not present

## 2022-05-09 DIAGNOSIS — F339 Major depressive disorder, recurrent, unspecified: Secondary | ICD-10-CM | POA: Diagnosis not present

## 2022-05-09 DIAGNOSIS — K219 Gastro-esophageal reflux disease without esophagitis: Secondary | ICD-10-CM | POA: Diagnosis not present

## 2022-05-09 DIAGNOSIS — G4733 Obstructive sleep apnea (adult) (pediatric): Secondary | ICD-10-CM | POA: Diagnosis not present

## 2022-05-30 DIAGNOSIS — I2510 Atherosclerotic heart disease of native coronary artery without angina pectoris: Secondary | ICD-10-CM | POA: Diagnosis not present

## 2022-05-30 DIAGNOSIS — E782 Mixed hyperlipidemia: Secondary | ICD-10-CM | POA: Diagnosis not present

## 2022-05-30 DIAGNOSIS — G4733 Obstructive sleep apnea (adult) (pediatric): Secondary | ICD-10-CM | POA: Diagnosis not present

## 2022-05-30 DIAGNOSIS — R0602 Shortness of breath: Secondary | ICD-10-CM | POA: Diagnosis not present

## 2022-06-06 DIAGNOSIS — R7302 Impaired glucose tolerance (oral): Secondary | ICD-10-CM | POA: Diagnosis not present

## 2022-06-06 DIAGNOSIS — E782 Mixed hyperlipidemia: Secondary | ICD-10-CM | POA: Diagnosis not present

## 2022-06-28 DIAGNOSIS — R7401 Elevation of levels of liver transaminase levels: Secondary | ICD-10-CM | POA: Diagnosis not present

## 2022-06-28 DIAGNOSIS — R748 Abnormal levels of other serum enzymes: Secondary | ICD-10-CM | POA: Diagnosis not present

## 2022-06-28 DIAGNOSIS — R1084 Generalized abdominal pain: Secondary | ICD-10-CM | POA: Diagnosis not present

## 2022-08-06 DIAGNOSIS — R053 Chronic cough: Secondary | ICD-10-CM | POA: Diagnosis not present

## 2022-08-06 DIAGNOSIS — H6063 Unspecified chronic otitis externa, bilateral: Secondary | ICD-10-CM | POA: Diagnosis not present

## 2022-08-06 DIAGNOSIS — H6123 Impacted cerumen, bilateral: Secondary | ICD-10-CM | POA: Diagnosis not present

## 2022-08-06 DIAGNOSIS — K219 Gastro-esophageal reflux disease without esophagitis: Secondary | ICD-10-CM | POA: Diagnosis not present

## 2022-08-27 DIAGNOSIS — L814 Other melanin hyperpigmentation: Secondary | ICD-10-CM | POA: Diagnosis not present

## 2022-08-27 DIAGNOSIS — D229 Melanocytic nevi, unspecified: Secondary | ICD-10-CM | POA: Diagnosis not present

## 2022-08-27 DIAGNOSIS — L219 Seborrheic dermatitis, unspecified: Secondary | ICD-10-CM | POA: Diagnosis not present

## 2022-08-27 DIAGNOSIS — D692 Other nonthrombocytopenic purpura: Secondary | ICD-10-CM | POA: Diagnosis not present

## 2022-08-27 DIAGNOSIS — L820 Inflamed seborrheic keratosis: Secondary | ICD-10-CM | POA: Diagnosis not present

## 2022-08-27 DIAGNOSIS — S40261A Insect bite (nonvenomous) of right shoulder, initial encounter: Secondary | ICD-10-CM | POA: Diagnosis not present

## 2022-10-08 DIAGNOSIS — R1013 Epigastric pain: Secondary | ICD-10-CM | POA: Diagnosis not present

## 2022-10-08 DIAGNOSIS — R109 Unspecified abdominal pain: Secondary | ICD-10-CM | POA: Diagnosis not present

## 2022-10-08 DIAGNOSIS — R140 Abdominal distension (gaseous): Secondary | ICD-10-CM | POA: Diagnosis not present

## 2022-10-08 DIAGNOSIS — R748 Abnormal levels of other serum enzymes: Secondary | ICD-10-CM | POA: Diagnosis not present

## 2022-10-08 DIAGNOSIS — K5900 Constipation, unspecified: Secondary | ICD-10-CM | POA: Diagnosis not present

## 2022-10-23 DIAGNOSIS — R42 Dizziness and giddiness: Secondary | ICD-10-CM | POA: Diagnosis not present

## 2022-10-23 DIAGNOSIS — G4733 Obstructive sleep apnea (adult) (pediatric): Secondary | ICD-10-CM | POA: Diagnosis not present

## 2022-10-23 DIAGNOSIS — Z87891 Personal history of nicotine dependence: Secondary | ICD-10-CM | POA: Diagnosis not present

## 2022-10-23 DIAGNOSIS — I25119 Atherosclerotic heart disease of native coronary artery with unspecified angina pectoris: Secondary | ICD-10-CM | POA: Diagnosis not present

## 2022-10-23 DIAGNOSIS — I10 Essential (primary) hypertension: Secondary | ICD-10-CM | POA: Diagnosis not present

## 2022-10-23 DIAGNOSIS — R110 Nausea: Secondary | ICD-10-CM | POA: Diagnosis not present

## 2022-10-23 DIAGNOSIS — I2119 ST elevation (STEMI) myocardial infarction involving other coronary artery of inferior wall: Secondary | ICD-10-CM | POA: Diagnosis not present

## 2022-10-23 DIAGNOSIS — E782 Mixed hyperlipidemia: Secondary | ICD-10-CM | POA: Diagnosis not present

## 2022-10-23 DIAGNOSIS — R0602 Shortness of breath: Secondary | ICD-10-CM | POA: Diagnosis not present

## 2022-10-31 DIAGNOSIS — R42 Dizziness and giddiness: Secondary | ICD-10-CM | POA: Diagnosis not present

## 2022-10-31 DIAGNOSIS — I25119 Atherosclerotic heart disease of native coronary artery with unspecified angina pectoris: Secondary | ICD-10-CM | POA: Diagnosis not present

## 2022-10-31 DIAGNOSIS — I2119 ST elevation (STEMI) myocardial infarction involving other coronary artery of inferior wall: Secondary | ICD-10-CM | POA: Diagnosis not present

## 2022-11-02 DIAGNOSIS — Z87891 Personal history of nicotine dependence: Secondary | ICD-10-CM | POA: Diagnosis not present

## 2022-11-02 DIAGNOSIS — Z122 Encounter for screening for malignant neoplasm of respiratory organs: Secondary | ICD-10-CM | POA: Diagnosis not present

## 2022-11-02 DIAGNOSIS — F17210 Nicotine dependence, cigarettes, uncomplicated: Secondary | ICD-10-CM | POA: Diagnosis not present

## 2022-11-05 DIAGNOSIS — Z87891 Personal history of nicotine dependence: Secondary | ICD-10-CM | POA: Diagnosis not present

## 2022-11-05 DIAGNOSIS — Z122 Encounter for screening for malignant neoplasm of respiratory organs: Secondary | ICD-10-CM | POA: Diagnosis not present

## 2022-11-08 DIAGNOSIS — R0602 Shortness of breath: Secondary | ICD-10-CM | POA: Diagnosis not present

## 2022-11-08 DIAGNOSIS — I6523 Occlusion and stenosis of bilateral carotid arteries: Secondary | ICD-10-CM | POA: Diagnosis not present

## 2022-11-08 DIAGNOSIS — R42 Dizziness and giddiness: Secondary | ICD-10-CM | POA: Diagnosis not present

## 2022-11-08 DIAGNOSIS — I25119 Atherosclerotic heart disease of native coronary artery with unspecified angina pectoris: Secondary | ICD-10-CM | POA: Diagnosis not present

## 2022-11-15 DIAGNOSIS — E782 Mixed hyperlipidemia: Secondary | ICD-10-CM | POA: Diagnosis not present

## 2022-11-15 DIAGNOSIS — I2119 ST elevation (STEMI) myocardial infarction involving other coronary artery of inferior wall: Secondary | ICD-10-CM | POA: Diagnosis not present

## 2022-11-15 DIAGNOSIS — I2510 Atherosclerotic heart disease of native coronary artery without angina pectoris: Secondary | ICD-10-CM | POA: Diagnosis not present

## 2022-11-15 DIAGNOSIS — R0602 Shortness of breath: Secondary | ICD-10-CM | POA: Diagnosis not present

## 2022-11-15 DIAGNOSIS — J449 Chronic obstructive pulmonary disease, unspecified: Secondary | ICD-10-CM | POA: Diagnosis not present

## 2022-11-20 DIAGNOSIS — E538 Deficiency of other specified B group vitamins: Secondary | ICD-10-CM | POA: Diagnosis not present

## 2022-11-20 DIAGNOSIS — I2510 Atherosclerotic heart disease of native coronary artery without angina pectoris: Secondary | ICD-10-CM | POA: Diagnosis not present

## 2022-11-20 DIAGNOSIS — I10 Essential (primary) hypertension: Secondary | ICD-10-CM | POA: Diagnosis not present

## 2022-11-20 DIAGNOSIS — Z125 Encounter for screening for malignant neoplasm of prostate: Secondary | ICD-10-CM | POA: Diagnosis not present

## 2022-11-20 DIAGNOSIS — K219 Gastro-esophageal reflux disease without esophagitis: Secondary | ICD-10-CM | POA: Diagnosis not present

## 2022-11-20 DIAGNOSIS — E782 Mixed hyperlipidemia: Secondary | ICD-10-CM | POA: Diagnosis not present

## 2022-11-20 DIAGNOSIS — F419 Anxiety disorder, unspecified: Secondary | ICD-10-CM | POA: Diagnosis not present

## 2022-11-20 DIAGNOSIS — R7302 Impaired glucose tolerance (oral): Secondary | ICD-10-CM | POA: Diagnosis not present

## 2022-11-20 DIAGNOSIS — F339 Major depressive disorder, recurrent, unspecified: Secondary | ICD-10-CM | POA: Diagnosis not present

## 2022-11-29 DIAGNOSIS — M25561 Pain in right knee: Secondary | ICD-10-CM | POA: Diagnosis not present

## 2022-11-29 DIAGNOSIS — M1711 Unilateral primary osteoarthritis, right knee: Secondary | ICD-10-CM | POA: Diagnosis not present

## 2022-12-26 DIAGNOSIS — R262 Difficulty in walking, not elsewhere classified: Secondary | ICD-10-CM | POA: Diagnosis not present

## 2022-12-26 DIAGNOSIS — G8929 Other chronic pain: Secondary | ICD-10-CM | POA: Diagnosis not present

## 2022-12-26 DIAGNOSIS — M25561 Pain in right knee: Secondary | ICD-10-CM | POA: Diagnosis not present

## 2022-12-26 DIAGNOSIS — M1711 Unilateral primary osteoarthritis, right knee: Secondary | ICD-10-CM | POA: Diagnosis not present

## 2023-01-16 DIAGNOSIS — K045 Chronic apical periodontitis: Secondary | ICD-10-CM | POA: Diagnosis not present

## 2023-01-30 DIAGNOSIS — J342 Deviated nasal septum: Secondary | ICD-10-CM | POA: Diagnosis not present

## 2023-01-30 DIAGNOSIS — G4733 Obstructive sleep apnea (adult) (pediatric): Secondary | ICD-10-CM | POA: Diagnosis not present

## 2023-01-30 DIAGNOSIS — R065 Mouth breathing: Secondary | ICD-10-CM | POA: Diagnosis not present

## 2023-02-18 DIAGNOSIS — R748 Abnormal levels of other serum enzymes: Secondary | ICD-10-CM | POA: Diagnosis not present

## 2023-02-18 DIAGNOSIS — R1013 Epigastric pain: Secondary | ICD-10-CM | POA: Diagnosis not present

## 2023-02-18 DIAGNOSIS — K5909 Other constipation: Secondary | ICD-10-CM | POA: Diagnosis not present

## 2023-02-20 DIAGNOSIS — R748 Abnormal levels of other serum enzymes: Secondary | ICD-10-CM | POA: Diagnosis not present

## 2023-02-20 DIAGNOSIS — Z1159 Encounter for screening for other viral diseases: Secondary | ICD-10-CM | POA: Diagnosis not present

## 2023-02-21 ENCOUNTER — Other Ambulatory Visit: Payer: Self-pay | Admitting: Gastroenterology

## 2023-02-21 DIAGNOSIS — R748 Abnormal levels of other serum enzymes: Secondary | ICD-10-CM

## 2023-02-22 DIAGNOSIS — R7989 Other specified abnormal findings of blood chemistry: Secondary | ICD-10-CM | POA: Diagnosis not present

## 2023-02-22 DIAGNOSIS — R748 Abnormal levels of other serum enzymes: Secondary | ICD-10-CM | POA: Diagnosis not present

## 2023-02-26 ENCOUNTER — Ambulatory Visit
Admission: RE | Admit: 2023-02-26 | Discharge: 2023-02-26 | Disposition: A | Discharge status: Home / Self Care | Payer: Medicare HMO | Source: Ambulatory Visit | Attending: Gastroenterology | Admitting: Gastroenterology

## 2023-02-26 DIAGNOSIS — R7989 Other specified abnormal findings of blood chemistry: Secondary | ICD-10-CM | POA: Diagnosis not present

## 2023-02-26 DIAGNOSIS — R748 Abnormal levels of other serum enzymes: Secondary | ICD-10-CM | POA: Insufficient documentation

## 2023-02-27 DIAGNOSIS — I252 Old myocardial infarction: Secondary | ICD-10-CM | POA: Diagnosis not present

## 2023-02-27 DIAGNOSIS — G4733 Obstructive sleep apnea (adult) (pediatric): Secondary | ICD-10-CM | POA: Diagnosis not present

## 2023-02-27 DIAGNOSIS — G8918 Other acute postprocedural pain: Secondary | ICD-10-CM | POA: Diagnosis not present

## 2023-02-27 DIAGNOSIS — Z87891 Personal history of nicotine dependence: Secondary | ICD-10-CM | POA: Diagnosis not present

## 2023-02-27 DIAGNOSIS — M1711 Unilateral primary osteoarthritis, right knee: Secondary | ICD-10-CM | POA: Diagnosis not present

## 2023-02-27 DIAGNOSIS — I2510 Atherosclerotic heart disease of native coronary artery without angina pectoris: Secondary | ICD-10-CM | POA: Diagnosis not present

## 2023-02-27 DIAGNOSIS — Z7902 Long term (current) use of antithrombotics/antiplatelets: Secondary | ICD-10-CM | POA: Diagnosis not present

## 2023-02-27 DIAGNOSIS — Z955 Presence of coronary angioplasty implant and graft: Secondary | ICD-10-CM | POA: Diagnosis not present

## 2023-02-27 DIAGNOSIS — E782 Mixed hyperlipidemia: Secondary | ICD-10-CM | POA: Diagnosis not present

## 2023-02-27 DIAGNOSIS — K219 Gastro-esophageal reflux disease without esophagitis: Secondary | ICD-10-CM | POA: Diagnosis not present

## 2023-02-28 DIAGNOSIS — M1711 Unilateral primary osteoarthritis, right knee: Secondary | ICD-10-CM | POA: Diagnosis not present

## 2023-02-28 DIAGNOSIS — I252 Old myocardial infarction: Secondary | ICD-10-CM | POA: Diagnosis not present

## 2023-02-28 DIAGNOSIS — I2510 Atherosclerotic heart disease of native coronary artery without angina pectoris: Secondary | ICD-10-CM | POA: Diagnosis not present

## 2023-02-28 DIAGNOSIS — Z7902 Long term (current) use of antithrombotics/antiplatelets: Secondary | ICD-10-CM | POA: Diagnosis not present

## 2023-02-28 DIAGNOSIS — G4733 Obstructive sleep apnea (adult) (pediatric): Secondary | ICD-10-CM | POA: Diagnosis not present

## 2023-02-28 DIAGNOSIS — E782 Mixed hyperlipidemia: Secondary | ICD-10-CM | POA: Diagnosis not present

## 2023-02-28 DIAGNOSIS — K219 Gastro-esophageal reflux disease without esophagitis: Secondary | ICD-10-CM | POA: Diagnosis not present

## 2023-02-28 DIAGNOSIS — Z87891 Personal history of nicotine dependence: Secondary | ICD-10-CM | POA: Diagnosis not present

## 2023-02-28 DIAGNOSIS — Z955 Presence of coronary angioplasty implant and graft: Secondary | ICD-10-CM | POA: Diagnosis not present

## 2023-03-05 DIAGNOSIS — R262 Difficulty in walking, not elsewhere classified: Secondary | ICD-10-CM | POA: Diagnosis not present

## 2023-03-05 DIAGNOSIS — Z4789 Encounter for other orthopedic aftercare: Secondary | ICD-10-CM | POA: Diagnosis not present

## 2023-03-05 DIAGNOSIS — M25561 Pain in right knee: Secondary | ICD-10-CM | POA: Diagnosis not present

## 2023-03-05 DIAGNOSIS — M1711 Unilateral primary osteoarthritis, right knee: Secondary | ICD-10-CM | POA: Diagnosis not present

## 2023-03-05 DIAGNOSIS — G8929 Other chronic pain: Secondary | ICD-10-CM | POA: Diagnosis not present

## 2023-03-05 DIAGNOSIS — R29898 Other symptoms and signs involving the musculoskeletal system: Secondary | ICD-10-CM | POA: Diagnosis not present

## 2023-03-05 DIAGNOSIS — M25661 Stiffness of right knee, not elsewhere classified: Secondary | ICD-10-CM | POA: Diagnosis not present

## 2023-03-07 DIAGNOSIS — Z87891 Personal history of nicotine dependence: Secondary | ICD-10-CM | POA: Diagnosis not present

## 2023-03-07 DIAGNOSIS — E782 Mixed hyperlipidemia: Secondary | ICD-10-CM | POA: Diagnosis not present

## 2023-03-07 DIAGNOSIS — I25118 Atherosclerotic heart disease of native coronary artery with other forms of angina pectoris: Secondary | ICD-10-CM | POA: Diagnosis not present

## 2023-03-07 DIAGNOSIS — R0602 Shortness of breath: Secondary | ICD-10-CM | POA: Diagnosis not present

## 2023-03-07 NOTE — Progress Notes (Signed)
03/12/2023 10:37 AM   William Conway [birth year] [id number]  Referring provider: Marina Goodell, MD [address] [place],  [place] [address]  Urological history: 1. High risk hematuria -former cigarette smoker, smokes marijuana -CTU (2015) NED -CTU (2017) NED  -cysto (2019) - mild BPH -contrast CT (2024) - renal cysts   2.  Erectile dysfunction -Contributing factors of age, BPH, CAD, MI, depression, sleep apnea, former smoker, alcohol consumption and marijuana use -Sildenafil 20 mg on demand dosing  3. BPH with LU TS -PSA (11/2022) 1.0 -Tamsulosin 0.4 mg daily  Chief Complaint  Patient presents with   Benign Prostatic Hypertrophy   HPI: William Conway is a 68 y.o. male who presents today for discussion of testosterone treatment.  Previous records reviewed.   He has noticed to decrease in libido, lack of energy, decrease in his strength and endurance, lost in height, a decrease in the enjoyment of life, he tends to be grumpy, his erections are less strong, he has had a recent deterioration in his ability to play sports, he is falling asleep after dinner and has a recent deterioration in his work performance.  He does have sleep apnea and he is not using his CPAP machine.  I did advise him that a lot of his symptoms could be the result of sleep apnea as well.  He states he has an appointment with a specialist in [facility] this month.  I PSS 10/3  He has no urinary complaints.  Patient denies any modifying or aggravating factors.  Patient denies any recent UTI's, gross hematuria, dysuria or suprapubic/flank pain.  Patient denies any fevers, chills, nausea or vomiting.     IPSS     Row Name 03/12/23 0900         International Prostate Symptom Score   How often have you had the sensation of not emptying your bladder? Less than 1 in 5     How often have you had to urinate less than every two hours? Less than 1 in 5 times     How often have you found you  stopped and started again several times when you urinated? Less than 1 in 5 times     How often have you found it difficult to postpone urination? Less than half the time     How often have you had a weak urinary stream? Less than half the time     How often have you had to strain to start urination? Less than half the time     How many times did you typically get up at night to urinate? 1 Time     Total IPSS Score 10       Quality of Life due to urinary symptoms   If you were to spend the rest of your life with your urinary condition just the way it is now how would you feel about that? Mixed              Score:  1-7 Mild 8-19 Moderate 20-35 Severe    SHIM 9  He is not having spontaneous erections, he denied any pain with erections and he denied any curvature with erections.   SHIM     Row Name 03/12/23 0959         SHIM: Over the last 6 months:   How do you rate your confidence that you could get and keep an erection? Low     When you had erections with  sexual stimulation, how often were your erections hard enough for penetration (entering your partner)? A Few Times (much less than half the time)     During sexual intercourse, how often were you able to maintain your erection after you had penetrated (entered) your partner? A Few Times (much less than half the time)     During sexual intercourse, how difficult was it to maintain your erection to completion of intercourse? Very Difficult     When you attempted sexual intercourse, how often was it satisfactory for you? Almost Never or Never       SHIM Total Score   SHIM 9              Score: 1-7 Severe ED 8-11 Moderate ED 12-16 Mild-Moderate ED 17-21 Mild ED 22-25 No ED   PMH: Past Medical History:  Diagnosis Date   Allergic rhinitis 11/17/2014   Coronary artery disease    Familial multiple lipoprotein-type hyperlipidemia 06/21/2014   GERD (gastroesophageal reflux disease)    Heart attack (HCC) 07/06/2014    Overview:  A. 2009: Perfusion Scan Normal B. STEMI: Cardiac Cath: EF 65%. Acute occlusion RCA, nonobstructive LAD, LCX 5/16 with [employer] [phone] DES    History of multiple pulmonary nodules 11/17/2014   Stable nodules 3 nodules 02/26/13    Mood disorder of depressed type 11/17/2014   Myocardial infarction (HCC) 06/20/2014   Overview:  A. 2009: Perfusion Scan Normal B. STEMI: Cardiac Cath: EF 65%. Acute occlusion RCA, nonobstructive LAD, LCX 5/16 with [employer] Promus 3X32 DES    Obstructive apnea 11/17/2014   Overview:  Overview:  Intolerant of CPAP    OSA (obstructive sleep apnea) 11/17/2014   Intolerant of CPAP    Seasonal and perennial allergic rhinitis 11/17/2014   ST elevation myocardial infarction (STEMI) (HCC) 07/06/2014   Overview:  Overview:  Overview:  A. 2009: Perfusion Scan Normal B. STEMI: Cardiac Cath: EF 65%. Acute occlusion RCA, nonobstructive LAD, LCX 5/16 with [employer] Promus 3X32 DES     Surgical History: Past Surgical History:  Procedure Laterality Date   COLONOSCOPY WITH PROPOFOL N/A 06/06/2021   Procedure: COLONOSCOPY WITH PROPOFOL;  Surgeon: Regis Bill, MD;  Location: [facility] ENDOSCOPY;  Service: Endoscopy;  Laterality: N/A;   CORONARY ANGIOPLASTY WITH STENT PLACEMENT  06/2014   to RCA   ESOPHAGOGASTRODUODENOSCOPY (EGD) WITH PROPOFOL N/A 06/06/2021   Procedure: ESOPHAGOGASTRODUODENOSCOPY (EGD) WITH PROPOFOL;  Surgeon: Regis Bill, MD;  Location: [facility] ENDOSCOPY;  Service: Endoscopy;  Laterality: N/A;   LEFT HEART CATH AND CORONARY ANGIOGRAPHY N/A 01/02/2017   Procedure: LEFT HEART CATH AND CORONARY ANGIOGRAPHY;  Surgeon: Marcina Millard, MD;  Location: [facility] INVASIVE CV LAB;  Service: Cardiovascular;  Laterality: N/A;    Home Medications:  Allergies as of 03/12/2023       Reactions   Atorvastatin Other (See Comments)   Muscle Pain   Procaine Other (See Comments)   Unknown        Medication List         Accurate as of March 12, 2023 10:37 AM. If you have any questions, ask your nurse or doctor.          STOP taking these medications    metoprolol tartrate 25 MG tablet Commonly known as: LOPRESSOR   tamsulosin 0.4 MG Caps capsule Commonly known as: FLOMAX       TAKE these medications    amLODipine 5 MG tablet Commonly known as: NORVASC Take 5 mg by mouth daily.  buPROPion 150 MG 24 hr tablet Commonly known as: WELLBUTRIN XL Take 150 mg daily by mouth.   calcipotriene-betamethasone external suspension Commonly known as: TACLONEX SCALP Apply 1 application as needed topically (for flare ups).   clopidogrel 75 MG tablet Commonly known as: PLAVIX Take 75 mg daily by mouth.   losartan 25 MG tablet Commonly known as: COZAAR Take 25 mg daily by mouth.   meclizine 25 MG tablet Commonly known as: ANTIVERT Take 1 tablet (25 mg total) by mouth 3 (three) times daily as needed for dizziness. What changed: when to take this   nitroGLYCERIN 0.4 MG SL tablet Commonly known as: NITROSTAT Place 0.4 mg every 5 (five) minutes as needed under the tongue for chest pain.   oxyCODONE 5 MG immediate release tablet Commonly known as: Oxy IR/ROXICODONE Take by mouth.   pantoprazole 20 MG tablet Commonly known as: PROTONIX Take 20 mg by mouth daily.   rosuvastatin 5 MG tablet Commonly known as: CRESTOR Take 5 mg 2 (two) times a week by mouth.   sertraline 100 MG tablet Commonly known as: ZOLOFT Take 100 mg by mouth daily.   sildenafil 20 MG tablet Commonly known as: REVATIO Take 3 to 5 tablets two hours before intercouse on an empty stomach.  Do not take with nitrates. What changed:  how much to take how to take this when to take this reasons to take this additional instructions        Allergies:  Allergies  Allergen Reactions   Atorvastatin Other (See Comments)    Muscle Pain   Procaine Other (See Comments)    Unknown    Family History: Family History   Problem Relation Age of Onset   Hypertension Brother    CAD Father    Prostate cancer Neg Hx     Social History:  reports that he quit smoking about 8 years ago. His smoking use included cigarettes. He has never used smokeless tobacco. He reports current alcohol use of about 4.0 standard drinks of alcohol per week. He reports current drug use. Drug: Marijuana.  ROS: Pertinent ROS in HPI  Physical Exam: BP (!) 151/93   Pulse 77   Ht 5\' 10"  (1.778 m)   Wt 195 lb (88.5 kg)   BMI 27.98 kg/m   Constitutional:  Well nourished. Alert and oriented, No acute distress. HEENT: [place] AT, moist mucus membranes.  Trachea midline, no masses. Cardiovascular: No clubbing, cyanosis, or edema. Respiratory: Normal respiratory effort, no increased work of breathing.  Lymph: No cervical or inguinal adenopathy. Neurologic: Grossly intact, no focal deficits, moving all 4 extremities. Psychiatric: Normal mood and affect.  Laboratory Data: CBC w/auto Differential (5 Part) Order: [id number] Component Ref Range & Units 13 d ago  WBC (White Blood Cell Count) 4.1 - 10.2 10^3/uL 4.5  RBC (Red Blood Cell Count) 4.69 - 6.13 10^6/uL 4.03 Low   Hemoglobin 14.1 - 18.1 gm/dL 47.8 Low   Hematocrit 29.5 - 52.0 % 37.4 Low   MCV (Mean Corpuscular Volume) 80.0 - 100.0 fl 92.8  MCH (Mean Corpuscular Hemoglobin) 27.0 - 31.2 pg 32.8 High   MCHC (Mean Corpuscular Hemoglobin Concentration) 32.0 - 36.0 gm/dL 62.1  Platelet Count 308 - 450 10^3/uL 207  RDW-CV (Red Cell Distribution Width) 11.6 - 14.8 % 12.2  MPV (Mean Platelet Volume) 9.4 - 12.4 fl 9.7  Neutrophils 1.50 - 7.80 10^3/uL 1.72  Lymphocytes 1.00 - 3.60 10^3/uL 2.12  Monocytes 0.00 - 1.50 10^3/uL 0.41  Eosinophils 0.00 - 0.55 10^3/uL  0.19  Basophils 0.00 - 0.09 10^3/uL 0.04  Neutrophil % 32.0 - 70.0 % 38.4  Lymphocyte % 10.0 - 50.0 % 47.2  Monocyte % 4.0 - 13.0 % 9.1  Eosinophil % 1.0 - 5.0 % 4.2  Basophil% 0.0 - 2.0 % 0.9  Immature  Granulocyte % <=0.7 % 0.2  Immature Granulocyte Count <=0.06 10^3/L 0.01  Resulting Agency [facility]   Specimen Collected: 02/22/23 09:17   Performed by: [facility] Last Resulted: 02/22/23 12:35  Received From: [facility]  Result Received: 02/26/23 08:10    Hepatic Function Panel (HFP) Order: [id number] Component Ref Range & Units 2 wk ago  Protein, Total 6.1 - 7.9 g/dL 7.1  Albumin 3.5 - 4.8 g/dL 4.5  Bilirubin, Total 0.3 - 1.2 mg/dL 0.9  Bilirubin, Conjugated 0.00 - 0.20 mg/dL 4.09  Alk Phos (alkaline Phosphatase) 34 - 104 U/L 72  AST 8 - 39 U/L 57 High   ALT 6 - 57 U/L 114 High   Resulting Agency [facility]   Specimen Collected: 02/18/23 16:09   Performed by: [facility] Last Resulted: 02/19/23 13:08  Received From: [facility]  Result Received: 02/21/23 12:11   Comprehensive Metabolic Panel (CMP) Order: [id number] Component Ref Range & Units 3 mo ago  Glucose 70 - 110 mg/dL 956 High   Sodium 213 - 145 mmol/L 136  Potassium 3.6 - 5.1 mmol/L 4.5  Chloride 97 - 109 mmol/L 100  Carbon Dioxide (CO2) 22.0 - 32.0 mmol/L 29.7  Urea Nitrogen (BUN) 7 - 25 mg/dL 13  Creatinine 0.7 - 1.3 mg/dL 0.8  Glomerular Filtration Rate (eGFR) >60 mL/min/1.73sq m 98  Comment: CKD-EPI (2021) does not include patient's race in the calculation of eGFR.  Monitoring changes of plasma creatinine and eGFR over time is useful for monitoring kidney function.  Interpretive Ranges for eGFR (CKD-EPI 2021):  eGFR:       >60 mL/min/1.73 sq. m - Normal eGFR:       30-59 mL/min/1.73 sq. m - Moderately Decreased eGFR:       15-29 mL/min/1.73 sq. m  - Severely Decreased eGFR:       < 15 mL/min/1.73 sq. m  - Kidney Failure   Note: These eGFR calculations do not apply in acute situations when eGFR is changing rapidly or patients on dialysis.  Calcium 8.7 - 10.3 mg/dL 9.5  AST 8 - 39 U/L 54  High   ALT 6 - 57 U/L 108 High   Alk Phos (alkaline Phosphatase) 34 - 104 U/L 69  Albumin 3.5 - 4.8 g/dL 4.5  Bilirubin, Total 0.3 - 1.2 mg/dL 0.9  Protein, Total 6.1 - 7.9 g/dL 6.8  A/G Ratio 1.0 - 5.0 gm/dL 2  Resulting Agency [facility]   Specimen Collected: 11/20/22 09:23   Performed by: [facility] Last Resulted: 11/20/22 17:08  Received From: [facility]  Result Received: 02/21/23 12:11   Lipid Panel w/calc LDL Order: [id number] Component Ref Range & Units 3 mo ago  Cholesterol, Total 100 - 200 mg/dL 629  Triglyceride 35 - 199 mg/dL 528  HDL (High Density Lipoprotein) Cholesterol 29.0 - 71.0 mg/dL 41.3  LDL Calculated 0 - 130 mg/dL 244  VLDL Cholesterol mg/dL 35  Cholesterol/HDL Ratio 5  Resulting Agency [facility]   Specimen Collected: 11/20/22 09:23   Performed by: [facility]  [facility] Last Resulted: 11/20/22 17:08  Received From: [facility]  Result Received: 02/21/23 12:11   Hemoglobin A1C Order: [id number] Component Ref Range & Units 3 mo ago  Hemoglobin A1C 4.2 - 5.6 % 5.7 High   Average Blood Glucose (Calc) mg/dL 295  Resulting Agency [facility]  Narrative Performed by [facility] - LAB Normal Range:    4.2 - 5.6% Increased Risk:  5.7 - 6.4% Diabetes:        >= 6.5% Glycemic Control for adults with diabetes:  <7%    Specimen Collected: 11/20/22 09:23   Performed by: [facility] Last Resulted: 11/20/22 13:02  Received From: [facility]  Result Received: 02/21/23 12:11   PSA, Total (Screen) Order: [id number] Component Ref Range & Units 3 mo ago  PSA (Prostate Specific Antigen), Total 0.10 - 4.00 ng/mL 1  Resulting Agency [facility]  Narrative Performed by [facility] - [facility] Test results were determined with Beckman Coulter Hybritech Assay. Values obtained with different  assay methods cannot be used interchangeably in serial testing. Assay results should not be interpreted as absolute evidence of the presence or absence of malignant disease  Specimen Collected: 11/20/22 09:23   Performed by: [facility] Last Resulted: 11/20/22 17:08  [facility]  Result Received: 02/21/23 12:11  I have reviewed the labs.   Pertinent Imaging: N/A  Assessment & Plan:    1. Testosterone discussion  -First a.m. testosterone level will be drawn today -explained that the diagnosis of testosterone deficiency/hypogonadism requires two morning testosterones at least two days apart below 300 to meet criteria - he has not met criteria  -explained that TRT is not a treatment for ED, he may see some improvement in his erections, but his ED will likely persist even with therapeutic levels of testosterone -Significant symptoms -We discussed the most common forms of replacement including intramuscular injection and gels and he desires to start injections -Potential side effects of testosterone replacement were discussed including stimulation of benign prostatic growth with lower urinary tract symptoms; erythrocytosis; edema; gynecomastia; worsening sleep apnea; venous thromboembolism; testicular atrophy and infertility. Recent studies suggesting an increased incidence of heart attack and stroke in patients taking testosterone was discussed. He was informed there is conflicting evidence regarding the impact of testosterone therapy on cardiovascular risk. The theoretical risk of growth stimulation of an undetected prostate cancer was also discussed.  He was informed that current evidence does not provide any definitive answers regarding the risks of testosterone therapy on prostate cancer and cardiovascular disease. The need for periodic monitoring of his testosterone level, PSA, hematocrit and DRE was discussed. -Advised him that he has untreated  sleep apnea and this will worsen likely elevating his hemoglobin hematocrit which will result in his need to donate blood or stop testosterone therapy altogether -I also explained that we will be monitoring his blood work every 3 months -Also explained that once he starts testosterone therapy will suppress any natural testosterone production and this will be considered a lifelong medication   2. Erectile dysfunction - I explained to the patient that in order to achieve an erection it takes good functioning of the nervous system (parasympathetic and sympathetic, sensory and motor), good blood flow into the erectile tissue of the penis and a desire to have sex - I explained that conditions like diabetes, hypertension, coronary artery disease, peripheral vascular disease, smoking, alcohol  consumption, age, sleep apnea and BPH can diminish the ability to have an erection -- we will obtain a serum testosterone level at this time; if it is abnormal we will need to repeat the study for confirmation  - A recent study published in Sex Med 2018 Apr 13 revealed moderate to vigorous aerobic exercise for 40 minutes 4 times per week can decrease erectile problems caused by physical inactivity, obesity, hypertension, metabolic syndrome and/or cardiovascular diseases  -Also advised him that sleep apnea contributes to ED as well - We discussed trying a PDE5 inhibitor, he has been prescribed nitroglycerin sublingual tablets, but he states he has not used it in 2 to 3 years, I advised him he cannot take the 2 in combination as it will result in a fatal drop in blood pressure -He understands this risk  3. BPH with LUTS -PSA stable  -continue conservative management, avoiding bladder irritants and timed voiding's -Explained that his prostate may grow with testosterone therapy and he may experience worsening of his urinary symptoms with testosterone therapy   Return for pending testosterone level .  These notes  generated with voice recognition software. I apologize for typographical errors.  Cloretta Ned  [facility] [address]  [address] [place], [place] [address] [phone]

## 2023-03-12 ENCOUNTER — Ambulatory Visit: Payer: Medicare HMO | Admitting: Urology

## 2023-03-12 ENCOUNTER — Encounter: Payer: Self-pay | Admitting: Urology

## 2023-03-12 VITALS — BP 151/93 | HR 77 | Ht 70.0 in | Wt 195.0 lb

## 2023-03-12 DIAGNOSIS — N401 Enlarged prostate with lower urinary tract symptoms: Secondary | ICD-10-CM | POA: Diagnosis not present

## 2023-03-12 DIAGNOSIS — N400 Enlarged prostate without lower urinary tract symptoms: Secondary | ICD-10-CM

## 2023-03-12 DIAGNOSIS — N5201 Erectile dysfunction due to arterial insufficiency: Secondary | ICD-10-CM | POA: Diagnosis not present

## 2023-03-12 DIAGNOSIS — R7989 Other specified abnormal findings of blood chemistry: Secondary | ICD-10-CM

## 2023-03-12 MED ORDER — SILDENAFIL CITRATE 20 MG PO TABS: ORAL_TABLET | ORAL | 3 refills | Status: AC

## 2023-03-13 LAB — TESTOSTERONE: Testosterone: 327 ng/dL (ref 264–916)

## 2023-03-14 ENCOUNTER — Other Ambulatory Visit: Payer: Medicare HMO

## 2023-03-14 DIAGNOSIS — G3184 Mild cognitive impairment, so stated: Secondary | ICD-10-CM | POA: Diagnosis not present

## 2023-03-14 DIAGNOSIS — G939 Disorder of brain, unspecified: Secondary | ICD-10-CM | POA: Diagnosis not present

## 2023-03-14 DIAGNOSIS — N400 Enlarged prostate without lower urinary tract symptoms: Secondary | ICD-10-CM | POA: Diagnosis not present

## 2023-03-14 DIAGNOSIS — E559 Vitamin D deficiency, unspecified: Secondary | ICD-10-CM | POA: Diagnosis not present

## 2023-03-14 DIAGNOSIS — M542 Cervicalgia: Secondary | ICD-10-CM | POA: Diagnosis not present

## 2023-03-14 DIAGNOSIS — M25461 Effusion, right knee: Secondary | ICD-10-CM | POA: Diagnosis not present

## 2023-03-14 DIAGNOSIS — G4733 Obstructive sleep apnea (adult) (pediatric): Secondary | ICD-10-CM | POA: Diagnosis not present

## 2023-03-14 DIAGNOSIS — R251 Tremor, unspecified: Secondary | ICD-10-CM | POA: Diagnosis not present

## 2023-03-14 DIAGNOSIS — R519 Headache, unspecified: Secondary | ICD-10-CM | POA: Diagnosis not present

## 2023-03-14 DIAGNOSIS — M1711 Unilateral primary osteoarthritis, right knee: Secondary | ICD-10-CM | POA: Diagnosis not present

## 2023-03-15 DIAGNOSIS — Z4789 Encounter for other orthopedic aftercare: Secondary | ICD-10-CM | POA: Diagnosis not present

## 2023-03-15 DIAGNOSIS — M25661 Stiffness of right knee, not elsewhere classified: Secondary | ICD-10-CM | POA: Diagnosis not present

## 2023-03-15 DIAGNOSIS — G8929 Other chronic pain: Secondary | ICD-10-CM | POA: Diagnosis not present

## 2023-03-15 DIAGNOSIS — R262 Difficulty in walking, not elsewhere classified: Secondary | ICD-10-CM | POA: Diagnosis not present

## 2023-03-15 DIAGNOSIS — R29898 Other symptoms and signs involving the musculoskeletal system: Secondary | ICD-10-CM | POA: Diagnosis not present

## 2023-03-15 DIAGNOSIS — M1711 Unilateral primary osteoarthritis, right knee: Secondary | ICD-10-CM | POA: Diagnosis not present

## 2023-03-15 DIAGNOSIS — M25561 Pain in right knee: Secondary | ICD-10-CM | POA: Diagnosis not present

## 2023-03-15 LAB — TESTOSTERONE: Testosterone: 463 ng/dL (ref 264–916)

## 2023-03-19 ENCOUNTER — Other Ambulatory Visit: Payer: Self-pay | Admitting: Neurology

## 2023-03-19 DIAGNOSIS — G3184 Mild cognitive impairment, so stated: Secondary | ICD-10-CM

## 2023-03-20 ENCOUNTER — Ambulatory Visit
Admission: RE | Admit: 2023-03-20 | Discharge: 2023-03-20 | Disposition: A | Discharge status: Home / Self Care | Payer: Medicare HMO | Source: Ambulatory Visit | Attending: Neurology | Admitting: Neurology

## 2023-03-20 DIAGNOSIS — G3184 Mild cognitive impairment, so stated: Secondary | ICD-10-CM | POA: Insufficient documentation

## 2023-03-20 DIAGNOSIS — G9389 Other specified disorders of brain: Secondary | ICD-10-CM | POA: Diagnosis not present

## 2023-03-20 DIAGNOSIS — G319 Degenerative disease of nervous system, unspecified: Secondary | ICD-10-CM | POA: Diagnosis not present

## 2023-03-20 DIAGNOSIS — I6782 Cerebral ischemia: Secondary | ICD-10-CM | POA: Diagnosis not present

## 2023-03-21 DIAGNOSIS — M25661 Stiffness of right knee, not elsewhere classified: Secondary | ICD-10-CM | POA: Diagnosis not present

## 2023-03-21 DIAGNOSIS — Z4789 Encounter for other orthopedic aftercare: Secondary | ICD-10-CM | POA: Diagnosis not present

## 2023-03-21 DIAGNOSIS — R29898 Other symptoms and signs involving the musculoskeletal system: Secondary | ICD-10-CM | POA: Diagnosis not present

## 2023-03-21 DIAGNOSIS — G8929 Other chronic pain: Secondary | ICD-10-CM | POA: Diagnosis not present

## 2023-03-21 DIAGNOSIS — M1711 Unilateral primary osteoarthritis, right knee: Secondary | ICD-10-CM | POA: Diagnosis not present

## 2023-03-25 DIAGNOSIS — R1013 Epigastric pain: Secondary | ICD-10-CM | POA: Diagnosis not present

## 2023-03-25 DIAGNOSIS — R748 Abnormal levels of other serum enzymes: Secondary | ICD-10-CM | POA: Diagnosis not present

## 2023-03-25 DIAGNOSIS — G8929 Other chronic pain: Secondary | ICD-10-CM | POA: Diagnosis not present

## 2023-04-11 DIAGNOSIS — R29898 Other symptoms and signs involving the musculoskeletal system: Secondary | ICD-10-CM | POA: Diagnosis not present

## 2023-04-11 DIAGNOSIS — M25461 Effusion, right knee: Secondary | ICD-10-CM | POA: Diagnosis not present

## 2023-04-11 DIAGNOSIS — M1711 Unilateral primary osteoarthritis, right knee: Secondary | ICD-10-CM | POA: Diagnosis not present

## 2023-04-11 DIAGNOSIS — Z96651 Presence of right artificial knee joint: Secondary | ICD-10-CM | POA: Diagnosis not present

## 2023-04-11 DIAGNOSIS — Z4789 Encounter for other orthopedic aftercare: Secondary | ICD-10-CM | POA: Diagnosis not present

## 2023-04-11 DIAGNOSIS — M21162 Varus deformity, not elsewhere classified, left knee: Secondary | ICD-10-CM | POA: Diagnosis not present

## 2023-04-11 DIAGNOSIS — M2170 Unequal limb length (acquired), unspecified site: Secondary | ICD-10-CM | POA: Diagnosis not present

## 2023-04-11 DIAGNOSIS — G8929 Other chronic pain: Secondary | ICD-10-CM | POA: Diagnosis not present

## 2023-04-11 DIAGNOSIS — M25661 Stiffness of right knee, not elsewhere classified: Secondary | ICD-10-CM | POA: Diagnosis not present

## 2023-04-11 DIAGNOSIS — M21161 Varus deformity, not elsewhere classified, right knee: Secondary | ICD-10-CM | POA: Diagnosis not present

## 2023-04-19 DIAGNOSIS — R29898 Other symptoms and signs involving the musculoskeletal system: Secondary | ICD-10-CM | POA: Diagnosis not present

## 2023-04-19 DIAGNOSIS — G8929 Other chronic pain: Secondary | ICD-10-CM | POA: Diagnosis not present

## 2023-04-19 DIAGNOSIS — Z4789 Encounter for other orthopedic aftercare: Secondary | ICD-10-CM | POA: Diagnosis not present

## 2023-04-19 DIAGNOSIS — R262 Difficulty in walking, not elsewhere classified: Secondary | ICD-10-CM | POA: Diagnosis not present

## 2023-04-19 DIAGNOSIS — M25661 Stiffness of right knee, not elsewhere classified: Secondary | ICD-10-CM | POA: Diagnosis not present

## 2023-04-26 DIAGNOSIS — R29898 Other symptoms and signs involving the musculoskeletal system: Secondary | ICD-10-CM | POA: Diagnosis not present

## 2023-04-26 DIAGNOSIS — R262 Difficulty in walking, not elsewhere classified: Secondary | ICD-10-CM | POA: Diagnosis not present

## 2023-04-26 DIAGNOSIS — M25661 Stiffness of right knee, not elsewhere classified: Secondary | ICD-10-CM | POA: Diagnosis not present

## 2023-04-26 DIAGNOSIS — Z4789 Encounter for other orthopedic aftercare: Secondary | ICD-10-CM | POA: Diagnosis not present

## 2023-04-26 DIAGNOSIS — G8929 Other chronic pain: Secondary | ICD-10-CM | POA: Diagnosis not present

## 2023-05-01 DIAGNOSIS — J449 Chronic obstructive pulmonary disease, unspecified: Secondary | ICD-10-CM | POA: Diagnosis not present

## 2023-05-01 DIAGNOSIS — R0789 Other chest pain: Secondary | ICD-10-CM | POA: Diagnosis not present

## 2023-05-01 DIAGNOSIS — K219 Gastro-esophageal reflux disease without esophagitis: Secondary | ICD-10-CM | POA: Diagnosis not present

## 2023-05-07 DIAGNOSIS — G8929 Other chronic pain: Secondary | ICD-10-CM | POA: Diagnosis not present

## 2023-05-07 DIAGNOSIS — Z4789 Encounter for other orthopedic aftercare: Secondary | ICD-10-CM | POA: Diagnosis not present

## 2023-05-07 DIAGNOSIS — R262 Difficulty in walking, not elsewhere classified: Secondary | ICD-10-CM | POA: Diagnosis not present

## 2023-05-07 DIAGNOSIS — R29898 Other symptoms and signs involving the musculoskeletal system: Secondary | ICD-10-CM | POA: Diagnosis not present

## 2023-05-07 DIAGNOSIS — M25661 Stiffness of right knee, not elsewhere classified: Secondary | ICD-10-CM | POA: Diagnosis not present

## 2023-05-10 ENCOUNTER — Ambulatory Visit: Admitting: Anesthesiology

## 2023-05-10 ENCOUNTER — Encounter: Payer: Self-pay | Admitting: *Deleted

## 2023-05-10 ENCOUNTER — Ambulatory Visit
Admission: RE | Admit: 2023-05-10 | Discharge: 2023-05-10 | Disposition: A | Discharge status: Home / Self Care | Attending: Gastroenterology | Admitting: Gastroenterology

## 2023-05-10 ENCOUNTER — Other Ambulatory Visit: Payer: Self-pay

## 2023-05-10 ENCOUNTER — Encounter
Admission: RE | Disposition: A | Discharge status: Home / Self Care | Payer: Self-pay | Source: Home / Self Care | Attending: Gastroenterology

## 2023-05-10 DIAGNOSIS — G4733 Obstructive sleep apnea (adult) (pediatric): Secondary | ICD-10-CM | POA: Diagnosis not present

## 2023-05-10 DIAGNOSIS — I2510 Atherosclerotic heart disease of native coronary artery without angina pectoris: Secondary | ICD-10-CM | POA: Diagnosis not present

## 2023-05-10 DIAGNOSIS — K449 Diaphragmatic hernia without obstruction or gangrene: Secondary | ICD-10-CM | POA: Insufficient documentation

## 2023-05-10 DIAGNOSIS — K259 Gastric ulcer, unspecified as acute or chronic, without hemorrhage or perforation: Secondary | ICD-10-CM | POA: Diagnosis not present

## 2023-05-10 DIAGNOSIS — K3189 Other diseases of stomach and duodenum: Secondary | ICD-10-CM | POA: Diagnosis not present

## 2023-05-10 DIAGNOSIS — K219 Gastro-esophageal reflux disease without esophagitis: Secondary | ICD-10-CM | POA: Diagnosis not present

## 2023-05-10 DIAGNOSIS — Z7902 Long term (current) use of antithrombotics/antiplatelets: Secondary | ICD-10-CM | POA: Insufficient documentation

## 2023-05-10 DIAGNOSIS — K2960 Other gastritis without bleeding: Secondary | ICD-10-CM | POA: Insufficient documentation

## 2023-05-10 DIAGNOSIS — I252 Old myocardial infarction: Secondary | ICD-10-CM | POA: Insufficient documentation

## 2023-05-10 DIAGNOSIS — K2970 Gastritis, unspecified, without bleeding: Secondary | ICD-10-CM | POA: Diagnosis not present

## 2023-05-10 DIAGNOSIS — Z9989 Dependence on other enabling machines and devices: Secondary | ICD-10-CM | POA: Diagnosis not present

## 2023-05-10 HISTORY — PX: ESOPHAGOGASTRODUODENOSCOPY: SHX5428

## 2023-05-10 SURGERY — EGD (ESOPHAGOGASTRODUODENOSCOPY)
Anesthesia: General

## 2023-05-10 MED ORDER — PROPOFOL 500 MG/50ML IV EMUL
INTRAVENOUS | Status: DC | PRN
  Administered 2023-05-10: 75 ug/kg/min via INTRAVENOUS

## 2023-05-10 MED ORDER — SODIUM CHLORIDE 0.9 % IV SOLN: INTRAVENOUS | Status: DC

## 2023-05-10 MED ORDER — GLYCOPYRROLATE 0.2 MG/ML IJ SOLN
INTRAMUSCULAR | Status: DC | PRN
  Administered 2023-05-10: .2 mg via INTRAVENOUS

## 2023-05-10 MED ORDER — PROPOFOL 10 MG/ML IV BOLUS
INTRAVENOUS | Status: DC | PRN
  Administered 2023-05-10 (×2): 50 mg via INTRAVENOUS

## 2023-05-10 MED ORDER — DEXMEDETOMIDINE HCL IN NACL 80 MCG/20ML IV SOLN
INTRAVENOUS | Status: DC | PRN
  Administered 2023-05-10: 20 ug via INTRAVENOUS

## 2023-05-10 MED ORDER — LIDOCAINE HCL (CARDIAC) PF 100 MG/5ML IV SOSY
PREFILLED_SYRINGE | INTRAVENOUS | Status: DC | PRN
  Administered 2023-05-10: 80 mg via INTRAVENOUS

## 2023-05-10 NOTE — Anesthesia Postprocedure Evaluation (Signed)
 Anesthesia Post Note  Patient: William Conway  Procedure(s) Performed: EGD (ESOPHAGOGASTRODUODENOSCOPY)  Patient location during evaluation: PACU Anesthesia Type: General Level of consciousness: awake and alert Pain management: satisfactory to patient Vital Signs Assessment: post-procedure vital signs reviewed and stable Respiratory status: spontaneous breathing Cardiovascular status: stable Anesthetic complications: no   No notable events documented.   Last Vitals:  Vitals:   05/10/23 1415 05/10/23 1435  BP: 111/72 100/82  Pulse: 67   Resp: 20   Temp: 37 C   SpO2: 95%     Last Pain:  Vitals:   05/10/23 1435  TempSrc:   PainSc: 0-No pain                 VAN STAVEREN,Ansh Fauble

## 2023-05-10 NOTE — Transfer of Care (Signed)
 Immediate Anesthesia Transfer of Care Note  Patient: William Conway  Procedure(s) Performed: EGD (ESOPHAGOGASTRODUODENOSCOPY)  Patient Location: PACU  Anesthesia Type:General  Level of Consciousness: sedated  Airway & Oxygen Therapy: Patient Spontanous Breathing  Post-op Assessment: Report given to RN and Post -op Vital signs reviewed and stable  Post vital signs: Reviewed and stable  Last Vitals:  Vitals Value Taken Time  BP 111/72 05/10/23 1416  Temp 37 C 05/10/23 1415  Pulse 66 05/10/23 1425  Resp 12 05/10/23 1425  SpO2 95 % 05/10/23 1425  Vitals shown include unfiled device data.  Last Pain:  Vitals:   05/10/23 1415  TempSrc: Temporal  PainSc: Asleep         Complications: No notable events documented.

## 2023-05-10 NOTE — Interval H&P Note (Signed)
 History and Physical Interval Note:  05/10/2023 1:58 PM  William Conway  has presented today for surgery, with the diagnosis of GERD,.  The various methods of treatment have been discussed with the patient and family. After consideration of risks, benefits and other options for treatment, the patient has consented to  Procedure(s): EGD (ESOPHAGOGASTRODUODENOSCOPY) (N/A) as a surgical intervention.  The patient's history has been reviewed, patient examined, no change in status, stable for surgery.  I have reviewed the patient's chart and labs.  Questions were answered to the patient's satisfaction.     Regis Bill  Ok to proceed with EGD

## 2023-05-10 NOTE — Anesthesia Preprocedure Evaluation (Signed)
 Anesthesia Evaluation  Patient identified by MRN, date of birth, ID band Patient awake    Reviewed: Allergy & Precautions, NPO status , Patient's Chart, lab work & pertinent test results  History of Anesthesia Complications Negative for: history of anesthetic complications  Airway Mallampati: III  TM Distance: <3 FB Neck ROM: full    Dental  (+) Chipped   Pulmonary neg shortness of breath, sleep apnea , former smoker   Pulmonary exam normal        Cardiovascular Exercise Tolerance: Good (-) angina + CAD and + Past MI  (-) DOE Normal cardiovascular exam     Neuro/Psych negative neurological ROS  negative psych ROS   GI/Hepatic Neg liver ROS,GERD  Controlled,,  Endo/Other  negative endocrine ROS    Renal/GU negative Renal ROS  negative genitourinary   Musculoskeletal   Abdominal   Peds  Hematology negative hematology ROS (+)   Anesthesia Other Findings Past Medical History: 11/17/2014: Allergic rhinitis No date: Coronary artery disease 06/21/2014: Familial multiple lipoprotein-type hyperlipidemia No date: GERD (gastroesophageal reflux disease) 07/06/2014: Heart attack (HCC)     Comment:  Overview:  A. 2009: Perfusion Scan Normal B. STEMI:               Cardiac Cath: EF 65%. Acute occlusion RCA, nonobstructive              LAD, LCX 5/16 with [employer] Promus 3X32 DES  11/17/2014: History of multiple pulmonary nodules     Comment:  Stable nodules 3 nodules 02/26/13  11/17/2014: Mood disorder of depressed type 06/20/2014: Myocardial infarction [facility])     Comment:  Overview:  A. 2009: Perfusion Scan Normal B. STEMI:               Cardiac Cath: EF 65%. Acute occlusion RCA, nonobstructive              LAD, LCX 5/16 with [employer] Promus 3X32 DES  11/17/2014: Obstructive apnea     Comment:  Overview:  Overview:  Intolerant of CPAP  11/17/2014: OSA (obstructive sleep apnea)     Comment:  Intolerant of  CPAP  11/17/2014: Seasonal and perennial allergic rhinitis 07/06/2014: ST elevation myocardial infarction (STEMI) (HCC)     Comment:  Overview:  Overview:  Overview:  A. 2009: Perfusion Scan              Normal B. STEMI: Cardiac Cath: EF 65%. Acute occlusion               RCA, nonobstructive LAD, LCX 5/16 with [employer]               Promus 3X32 DES   Past Surgical History: 06/06/2021: COLONOSCOPY WITH PROPOFOL; N/A     Comment:  Procedure: COLONOSCOPY WITH PROPOFOL;  Surgeon:               Regis Bill, MD;  Location: [facility] ENDOSCOPY;                Service: Endoscopy;  Laterality: N/A; 06/2014: CORONARY ANGIOPLASTY WITH STENT PLACEMENT     Comment:  to RCA 06/06/2021: ESOPHAGOGASTRODUODENOSCOPY (EGD) WITH PROPOFOL; N/A     Comment:  Procedure: ESOPHAGOGASTRODUODENOSCOPY (EGD) WITH               PROPOFOL;  Surgeon: Regis Bill, MD;  Location:               [facility] ENDOSCOPY;  Service: Endoscopy;  Laterality: N/A; 01/02/2017: LEFT HEART CATH  AND CORONARY ANGIOGRAPHY; N/A     Comment:  Procedure: LEFT HEART CATH AND CORONARY ANGIOGRAPHY;                Surgeon: Marcina Millard, MD;  Location: [facility]               INVASIVE CV LAB;  Service: Cardiovascular;  Laterality:               N/A;     Reproductive/Obstetrics negative OB ROS                             Anesthesia Physical Anesthesia Plan  ASA: 3  Anesthesia Plan: General   Post-op Pain Management:    Induction: Intravenous  PONV Risk Score and Plan: Propofol infusion and TIVA  Airway Management Planned: Natural Airway and Nasal Cannula  Additional Equipment:   Intra-op Plan:   Post-operative Plan:   Informed Consent: I have reviewed the patients History and Physical, chart, labs and discussed the procedure including the risks, benefits and alternatives for the proposed anesthesia with the patient or authorized representative who has indicated his/her understanding and  acceptance.     Dental Advisory Given  Plan Discussed with: Anesthesiologist, CRNA and Surgeon  Anesthesia Plan Comments: (Patient consented for risks of anesthesia including but not limited to:  - adverse reactions to medications - risk of airway placement if required - damage to eyes, teeth, lips or other oral mucosa - nerve damage due to positioning  - sore throat or hoarseness - Damage to heart, brain, nerves, lungs, other parts of body or loss of life  Patient voiced understanding and assent.)       Anesthesia Quick Evaluation

## 2023-05-10 NOTE — Op Note (Signed)
 [facility] Gastroenterology Patient Name: William Conway Procedure Date: 05/10/2023 1:50 PM MRN: [id number] Account #: [id number] Date of Birth: 06-18-1955 Admit Type: Outpatient Age: 68 Room: [facility] ENDO ROOM 3 Gender: Male Note Status: Finalized Instrument Name: Upper Endoscope [id number] Procedure:             Upper GI endoscopy Indications:           Gastro-esophageal reflux disease Providers:             Eather Colas MD, MD Referring MD:          Marina Goodell (Referring MD) Medicines:             Monitored Anesthesia Care Complications:         No immediate complications. Estimated blood loss:                         Minimal. Procedure:             Pre-Anesthesia Assessment:                        - Prior to the procedure, a History and Physical was                         performed, and patient medications and allergies were                         reviewed. The patient is competent. The risks and                         benefits of the procedure and the sedation options and                         risks were discussed with the patient. All questions                         were answered and informed consent was obtained.                         Patient identification and proposed procedure were                         verified by the physician, the nurse, the                         anesthesiologist, the anesthetist and the technician                         in the endoscopy suite. Mental Status Examination:                         alert and oriented. Airway Examination: normal                         oropharyngeal airway and neck mobility. Respiratory                         Examination: clear to auscultation. CV Examination:  normal. Prophylactic Antibiotics: The patient does not                         require prophylactic antibiotics. Prior                         Anticoagulants: The patient has taken Plavix                          (clopidogrel), last dose was 4 days prior to                         procedure. ASA Grade Assessment: III - A patient with                         severe systemic disease. After reviewing the risks and                         benefits, the patient was deemed in satisfactory                         condition to undergo the procedure. The anesthesia                         plan was to use monitored anesthesia care (MAC).                         Immediately prior to administration of medications,                         the patient was re-assessed for adequacy to receive                         sedatives. The heart rate, respiratory rate, oxygen                         saturations, blood pressure, adequacy of pulmonary                         ventilation, and response to care were monitored                         throughout the procedure. The physical status of the                         patient was re-assessed after the procedure.                        After obtaining informed consent, the endoscope was                         passed under direct vision. Throughout the procedure,                         the patient's blood pressure, pulse, and oxygen                         saturations were monitored continuously. The Endoscope  was introduced through the mouth, and advanced to the                         second part of duodenum. The upper GI endoscopy was                         accomplished without difficulty. The patient tolerated                         the procedure well. Findings:      A small hiatal hernia was present.      The exam of the esophagus was otherwise normal.      Multiple dispersed small erosions with no bleeding and no stigmata of       recent bleeding were found in the gastric antrum. Biopsies were taken       with a cold forceps for Helicobacter pylori testing. Estimated blood       loss was minimal.      The examined duodenum was  normal. Impression:            - Small hiatal hernia.                        - Erosive gastropathy with no bleeding and no stigmata                         of recent bleeding. Biopsied.                        - Normal examined duodenum. Recommendation:        - Discharge patient to home.                        - Resume previous diet.                        - Continue present medications.                        - Resume Plavix (clopidogrel) at prior dose today.                        - Use a proton pump inhibitor PO daily.                        - No ibuprofen, naproxen, or other non-steroidal                         anti-inflammatory drugs.                        - Return to referring physician as previously                         scheduled. Procedure Code(s):     --- Professional ---                        [phone], Esophagogastroduodenoscopy, flexible,                         transoral; with biopsy, single or  multiple Diagnosis Code(s):     --- Professional ---                        K44.9, Diaphragmatic hernia without obstruction or                         gangrene                        K31.89, Other diseases of stomach and duodenum                        K21.9, Gastro-esophageal reflux disease without                         esophagitis CPT copyright 2022 [employer]. All rights reserved. The codes documented in this report are preliminary and upon coder review may  be revised to meet current compliance requirements. Eather Colas MD, MD 05/10/2023 2:19:08 PM Number of Addenda: 0 Note Initiated On: 05/10/2023 1:50 PM Estimated Blood Loss:  Estimated blood loss was minimal.      [facility]

## 2023-05-10 NOTE — H&P (Signed)
 Outpatient short stay form Pre-procedure 05/10/2023  Regis Bill, MD  Primary Physician: Marina Goodell, MD  Reason for visit:  GERD  History of present illness:    68 y/o gentleman with history of GERD, CAD, and OSA here for GERD/dysphagia. States he took his plavix 4 days ago. Normally we want it held 5 days so told him we could do biopsies if needed but would unable to dilate if a stricture is noted. No neck or abdominal surgeries. No family history of GI malignancies.    Current Facility-Administered Medications:    0.9 %  sodium chloride infusion, , Intravenous, Continuous, Aleczander Fandino, Rossie Muskrat, MD, Last Rate: 20 mL/hr at 05/10/23 1341, Continued from Pre-op at 05/10/23 1341  Medications Prior to Admission  Medication Sig Dispense Refill Last Dose/Taking   amLODipine (NORVASC) 5 MG tablet Take 5 mg by mouth daily.   05/09/2023   buPROPion (WELLBUTRIN XL) 150 MG 24 hr tablet Take 150 mg daily by mouth.   05/09/2023   losartan (COZAAR) 25 MG tablet Take 25 mg daily by mouth.    05/09/2023   oxyCODONE (OXY IR/ROXICODONE) 5 MG immediate release tablet Take by mouth.   Past Week   pantoprazole (PROTONIX) 20 MG tablet Take 20 mg by mouth daily.   05/09/2023   rosuvastatin (CRESTOR) 5 MG tablet Take 5 mg 2 (two) times a week by mouth.    05/09/2023   sertraline (ZOLOFT) 100 MG tablet Take 100 mg by mouth daily.  0 05/09/2023   calcipotriene-betamethasone (TACLONEX SCALP) external suspension Apply 1 application as needed topically (for flare ups).       clopidogrel (PLAVIX) 75 MG tablet Take 75 mg daily by mouth.   05/06/2023   meclizine (ANTIVERT) 25 MG tablet Take 1 tablet (25 mg total) by mouth 3 (three) times daily as needed for dizziness. (Patient taking differently: Take 25 mg by mouth as needed for dizziness.) 30 tablet 0    nitroGLYCERIN (NITROSTAT) 0.4 MG SL tablet Place 0.4 mg every 5 (five) minutes as needed under the tongue for chest pain.       sildenafil (REVATIO) 20 MG  tablet Take 3 to 5 tablets two hours before intercouse on an empty stomach.  Do not take with nitrates. 30 tablet 3      Allergies  Allergen Reactions   Atorvastatin Other (See Comments)    Muscle Pain   Procaine Other (See Comments)    Unknown     Past Medical History:  Diagnosis Date   Allergic rhinitis 11/17/2014   Coronary artery disease    Familial multiple lipoprotein-type hyperlipidemia 06/21/2014   GERD (gastroesophageal reflux disease)    Heart attack (HCC) 07/06/2014   Overview:  A. 2009: Perfusion Scan Normal B. STEMI: Cardiac Cath: EF 65%. Acute occlusion RCA, nonobstructive LAD, LCX 5/16 with [employer] [phone] DES    History of multiple pulmonary nodules 11/17/2014   Stable nodules 3 nodules 02/26/13    Mood disorder of depressed type 11/17/2014   Myocardial infarction (HCC) 06/20/2014   Overview:  A. 2009: Perfusion Scan Normal B. STEMI: Cardiac Cath: EF 65%. Acute occlusion RCA, nonobstructive LAD, LCX 5/16 with [employer] Promus [id number] DES    Obstructive apnea 11/17/2014   Overview:  Overview:  Intolerant of CPAP    OSA (obstructive sleep apnea) 11/17/2014   Intolerant of CPAP    Seasonal and perennial allergic rhinitis 11/17/2014   ST elevation myocardial infarction (STEMI) (HCC) 07/06/2014   Overview:  Overview:  Overview:  A. 2009: Perfusion Scan Normal B. STEMI: Cardiac Cath: EF 65%. Acute occlusion RCA, nonobstructive LAD, LCX 5/16 with [employer] Promus [phone] DES     Review of systems:  Otherwise negative.    Physical Exam  Gen: Alert, oriented. Appears stated age.  HEENT: PERRLA. Lungs: No respiratory distress CV: RRR Abd: soft, benign, no masses Ext: No edema    Planned procedures: Proceed with EGD. The patient understands the nature of the planned procedure, indications, risks, alternatives and potential complications including but not limited to bleeding, infection, perforation, damage to internal organs and possible  oversedation/side effects from anesthesia. The patient agrees and gives consent to proceed.  Please refer to procedure notes for findings, recommendations and patient disposition/instructions.     Regis Bill, MD [facility] Gastroenterology

## 2023-05-13 ENCOUNTER — Encounter: Payer: Self-pay | Admitting: Gastroenterology

## 2023-05-13 LAB — SURGICAL PATHOLOGY

## 2023-05-23 DIAGNOSIS — Z96651 Presence of right artificial knee joint: Secondary | ICD-10-CM | POA: Diagnosis not present

## 2023-05-23 DIAGNOSIS — M25461 Effusion, right knee: Secondary | ICD-10-CM | POA: Diagnosis not present

## 2023-05-23 DIAGNOSIS — M76891 Other specified enthesopathies of right lower limb, excluding foot: Secondary | ICD-10-CM | POA: Diagnosis not present

## 2023-05-27 DIAGNOSIS — R7302 Impaired glucose tolerance (oral): Secondary | ICD-10-CM | POA: Diagnosis not present

## 2023-05-27 DIAGNOSIS — K219 Gastro-esophageal reflux disease without esophagitis: Secondary | ICD-10-CM | POA: Diagnosis not present

## 2023-05-27 DIAGNOSIS — I2510 Atherosclerotic heart disease of native coronary artery without angina pectoris: Secondary | ICD-10-CM | POA: Diagnosis not present

## 2023-05-27 DIAGNOSIS — F419 Anxiety disorder, unspecified: Secondary | ICD-10-CM | POA: Diagnosis not present

## 2023-05-27 DIAGNOSIS — E782 Mixed hyperlipidemia: Secondary | ICD-10-CM | POA: Diagnosis not present

## 2023-05-27 DIAGNOSIS — G4733 Obstructive sleep apnea (adult) (pediatric): Secondary | ICD-10-CM | POA: Diagnosis not present

## 2023-05-27 DIAGNOSIS — Z1331 Encounter for screening for depression: Secondary | ICD-10-CM | POA: Diagnosis not present

## 2023-05-27 DIAGNOSIS — F339 Major depressive disorder, recurrent, unspecified: Secondary | ICD-10-CM | POA: Diagnosis not present

## 2023-05-27 DIAGNOSIS — J449 Chronic obstructive pulmonary disease, unspecified: Secondary | ICD-10-CM | POA: Diagnosis not present

## 2023-05-27 DIAGNOSIS — I10 Essential (primary) hypertension: Secondary | ICD-10-CM | POA: Diagnosis not present

## 2023-05-27 DIAGNOSIS — Z0000 Encounter for general adult medical examination without abnormal findings: Secondary | ICD-10-CM | POA: Diagnosis not present

## 2023-06-05 DIAGNOSIS — Z87891 Personal history of nicotine dependence: Secondary | ICD-10-CM | POA: Diagnosis not present

## 2023-06-05 DIAGNOSIS — I2510 Atherosclerotic heart disease of native coronary artery without angina pectoris: Secondary | ICD-10-CM | POA: Diagnosis not present

## 2023-06-05 DIAGNOSIS — R0602 Shortness of breath: Secondary | ICD-10-CM | POA: Diagnosis not present

## 2023-06-05 DIAGNOSIS — R0609 Other forms of dyspnea: Secondary | ICD-10-CM | POA: Diagnosis not present

## 2023-06-05 DIAGNOSIS — R5382 Chronic fatigue, unspecified: Secondary | ICD-10-CM | POA: Diagnosis not present

## 2023-06-05 DIAGNOSIS — I2119 ST elevation (STEMI) myocardial infarction involving other coronary artery of inferior wall: Secondary | ICD-10-CM | POA: Diagnosis not present

## 2023-06-05 DIAGNOSIS — E782 Mixed hyperlipidemia: Secondary | ICD-10-CM | POA: Diagnosis not present

## 2023-06-10 DIAGNOSIS — E782 Mixed hyperlipidemia: Secondary | ICD-10-CM | POA: Diagnosis not present

## 2023-06-10 DIAGNOSIS — R7302 Impaired glucose tolerance (oral): Secondary | ICD-10-CM | POA: Diagnosis not present

## 2023-06-20 DIAGNOSIS — R0602 Shortness of breath: Secondary | ICD-10-CM | POA: Diagnosis not present

## 2023-06-20 DIAGNOSIS — G4733 Obstructive sleep apnea (adult) (pediatric): Secondary | ICD-10-CM | POA: Diagnosis not present

## 2023-07-05 DIAGNOSIS — R519 Headache, unspecified: Secondary | ICD-10-CM | POA: Diagnosis not present

## 2023-07-05 DIAGNOSIS — M542 Cervicalgia: Secondary | ICD-10-CM | POA: Diagnosis not present

## 2023-07-05 DIAGNOSIS — Z1331 Encounter for screening for depression: Secondary | ICD-10-CM | POA: Diagnosis not present

## 2023-07-05 DIAGNOSIS — G4733 Obstructive sleep apnea (adult) (pediatric): Secondary | ICD-10-CM | POA: Diagnosis not present

## 2023-07-05 DIAGNOSIS — M7918 Myalgia, other site: Secondary | ICD-10-CM | POA: Diagnosis not present

## 2023-07-18 DIAGNOSIS — G4733 Obstructive sleep apnea (adult) (pediatric): Secondary | ICD-10-CM | POA: Diagnosis not present

## 2023-07-18 DIAGNOSIS — R053 Chronic cough: Secondary | ICD-10-CM | POA: Diagnosis not present

## 2023-08-06 DIAGNOSIS — G4730 Sleep apnea, unspecified: Secondary | ICD-10-CM | POA: Diagnosis not present

## 2023-08-29 DIAGNOSIS — M1711 Unilateral primary osteoarthritis, right knee: Secondary | ICD-10-CM | POA: Diagnosis not present

## 2023-08-29 DIAGNOSIS — Z96651 Presence of right artificial knee joint: Secondary | ICD-10-CM | POA: Diagnosis not present

## 2023-08-29 DIAGNOSIS — M25561 Pain in right knee: Secondary | ICD-10-CM | POA: Diagnosis not present

## 2023-09-23 DIAGNOSIS — K219 Gastro-esophageal reflux disease without esophagitis: Secondary | ICD-10-CM | POA: Diagnosis not present

## 2023-09-23 DIAGNOSIS — R748 Abnormal levels of other serum enzymes: Secondary | ICD-10-CM | POA: Diagnosis not present

## 2023-09-24 ENCOUNTER — Other Ambulatory Visit: Payer: Self-pay | Admitting: Gastroenterology

## 2023-09-24 DIAGNOSIS — R748 Abnormal levels of other serum enzymes: Secondary | ICD-10-CM

## 2023-10-07 ENCOUNTER — Ambulatory Visit
Admission: RE | Admit: 2023-10-07 | Discharge: 2023-10-07 | Disposition: A | Discharge status: Home / Self Care | Source: Ambulatory Visit | Attending: Gastroenterology | Admitting: Gastroenterology

## 2023-10-07 DIAGNOSIS — R748 Abnormal levels of other serum enzymes: Secondary | ICD-10-CM | POA: Diagnosis not present

## 2023-10-07 DIAGNOSIS — K7689 Other specified diseases of liver: Secondary | ICD-10-CM | POA: Diagnosis not present

## 2023-11-04 DIAGNOSIS — Z122 Encounter for screening for malignant neoplasm of respiratory organs: Secondary | ICD-10-CM | POA: Diagnosis not present

## 2023-11-04 DIAGNOSIS — Z87891 Personal history of nicotine dependence: Secondary | ICD-10-CM | POA: Diagnosis not present

## 2023-11-07 DIAGNOSIS — Z09 Encounter for follow-up examination after completed treatment for conditions other than malignant neoplasm: Secondary | ICD-10-CM | POA: Diagnosis not present

## 2023-11-07 DIAGNOSIS — Z87891 Personal history of nicotine dependence: Secondary | ICD-10-CM | POA: Diagnosis not present

## 2023-11-11 DIAGNOSIS — Z1331 Encounter for screening for depression: Secondary | ICD-10-CM | POA: Diagnosis not present

## 2023-11-11 DIAGNOSIS — L409 Psoriasis, unspecified: Secondary | ICD-10-CM | POA: Diagnosis not present

## 2023-12-10 DIAGNOSIS — Z1331 Encounter for screening for depression: Secondary | ICD-10-CM | POA: Diagnosis not present

## 2023-12-10 DIAGNOSIS — F419 Anxiety disorder, unspecified: Secondary | ICD-10-CM | POA: Diagnosis not present

## 2023-12-10 DIAGNOSIS — K219 Gastro-esophageal reflux disease without esophagitis: Secondary | ICD-10-CM | POA: Diagnosis not present

## 2023-12-10 DIAGNOSIS — F339 Major depressive disorder, recurrent, unspecified: Secondary | ICD-10-CM | POA: Diagnosis not present

## 2023-12-10 DIAGNOSIS — G4733 Obstructive sleep apnea (adult) (pediatric): Secondary | ICD-10-CM | POA: Diagnosis not present

## 2023-12-10 DIAGNOSIS — J449 Chronic obstructive pulmonary disease, unspecified: Secondary | ICD-10-CM | POA: Diagnosis not present

## 2023-12-10 DIAGNOSIS — E782 Mixed hyperlipidemia: Secondary | ICD-10-CM | POA: Diagnosis not present

## 2023-12-10 DIAGNOSIS — R7302 Impaired glucose tolerance (oral): Secondary | ICD-10-CM | POA: Diagnosis not present

## 2023-12-10 DIAGNOSIS — I2510 Atherosclerotic heart disease of native coronary artery without angina pectoris: Secondary | ICD-10-CM | POA: Diagnosis not present

## 2023-12-10 DIAGNOSIS — I10 Essential (primary) hypertension: Secondary | ICD-10-CM | POA: Diagnosis not present

## 2023-12-11 DIAGNOSIS — Z125 Encounter for screening for malignant neoplasm of prostate: Secondary | ICD-10-CM | POA: Diagnosis not present

## 2023-12-11 DIAGNOSIS — R7302 Impaired glucose tolerance (oral): Secondary | ICD-10-CM | POA: Diagnosis not present

## 2023-12-11 DIAGNOSIS — E782 Mixed hyperlipidemia: Secondary | ICD-10-CM | POA: Diagnosis not present
